# Patient Record
Sex: Female | Born: 1990 | Race: Black or African American | Hispanic: No | Smoking: Never smoker
Health system: Southern US, Community
[De-identification: ages and names within clinical notes are randomized; demographics above are authoritative.]

## PROBLEM LIST (undated history)

## (undated) DIAGNOSIS — J45909 Unspecified asthma, uncomplicated: Secondary | ICD-10-CM

## (undated) DIAGNOSIS — D649 Anemia, unspecified: Secondary | ICD-10-CM

## (undated) DIAGNOSIS — G932 Benign intracranial hypertension: Secondary | ICD-10-CM

## (undated) HISTORY — PX: EYE SURGERY: SHX253

---
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
## 2009-05-23 ENCOUNTER — Emergency Department (HOSPITAL_COMMUNITY): Admission: EM | Admit: 2009-05-23 | Discharge: 2009-05-24 | Payer: Self-pay | Admitting: Emergency Medicine

## 2010-04-08 ENCOUNTER — Emergency Department (HOSPITAL_COMMUNITY)
Admission: EM | Admit: 2010-04-08 | Discharge: 2010-04-09 | Disposition: A | Discharge status: Home / Self Care | Payer: Self-pay | Attending: Emergency Medicine | Admitting: Emergency Medicine

## 2010-04-08 DIAGNOSIS — R42 Dizziness and giddiness: Secondary | ICD-10-CM | POA: Insufficient documentation

## 2010-04-08 DIAGNOSIS — N938 Other specified abnormal uterine and vaginal bleeding: Secondary | ICD-10-CM | POA: Insufficient documentation

## 2010-04-08 DIAGNOSIS — R110 Nausea: Secondary | ICD-10-CM | POA: Insufficient documentation

## 2010-04-08 DIAGNOSIS — N949 Unspecified condition associated with female genital organs and menstrual cycle: Secondary | ICD-10-CM | POA: Insufficient documentation

## 2010-04-08 LAB — URINE MICROSCOPIC-ADD ON

## 2010-04-08 LAB — URINALYSIS, ROUTINE W REFLEX MICROSCOPIC
Bilirubin Urine: NEGATIVE
Specific Gravity, Urine: 1.025 (ref 1.005–1.030)
pH: 7 (ref 5.0–8.0)

## 2010-04-08 LAB — POCT PREGNANCY, URINE: Preg Test, Ur: NEGATIVE

## 2010-12-01 ENCOUNTER — Inpatient Hospital Stay (HOSPITAL_COMMUNITY)
Admission: EM | Admit: 2010-12-01 | Discharge: 2010-12-05 | DRG: 195 | Disposition: A | Discharge status: Home / Self Care | Payer: Self-pay | Attending: Family Medicine | Admitting: Family Medicine

## 2010-12-01 ENCOUNTER — Encounter: Payer: Self-pay | Admitting: *Deleted

## 2010-12-01 ENCOUNTER — Emergency Department (HOSPITAL_COMMUNITY): Payer: Self-pay

## 2010-12-01 DIAGNOSIS — R000 Tachycardia, unspecified: Secondary | ICD-10-CM | POA: Diagnosis present

## 2010-12-01 DIAGNOSIS — J180 Bronchopneumonia, unspecified organism: Secondary | ICD-10-CM

## 2010-12-01 DIAGNOSIS — J189 Pneumonia, unspecified organism: Principal | ICD-10-CM | POA: Diagnosis present

## 2010-12-01 DIAGNOSIS — D509 Iron deficiency anemia, unspecified: Secondary | ICD-10-CM | POA: Diagnosis present

## 2010-12-01 DIAGNOSIS — R0902 Hypoxemia: Secondary | ICD-10-CM | POA: Diagnosis present

## 2010-12-01 LAB — BASIC METABOLIC PANEL
CO2: 23 mEq/L (ref 19–32)
Calcium: 9.2 mg/dL (ref 8.4–10.5)
Creatinine, Ser: 0.88 mg/dL (ref 0.50–1.10)
GFR calc Af Amer: >90 mL/min (ref >90)
GFR calc non Af Amer: >90 mL/min (ref >90)
Sodium: 136 mEq/L (ref 135–145)

## 2010-12-01 LAB — DIFFERENTIAL
Basophils Absolute: 0.1 10*3/uL (ref 0.0–0.1)
Basophils Relative: 1 % (ref 0–1)
Eosinophils Relative: 8 % — ABNORMAL HIGH (ref 0–5)
Lymphocytes Relative: 21 % (ref 12–46)

## 2010-12-01 LAB — URINALYSIS, ROUTINE W REFLEX MICROSCOPIC
Glucose, UA: NEGATIVE mg/dL
Ketones, ur: NEGATIVE mg/dL
Leukocytes, UA: NEGATIVE
Nitrite: NEGATIVE
Protein, ur: 100 mg/dL — AB
Urobilinogen, UA: 0.2 mg/dL (ref 0.0–1.0)

## 2010-12-01 LAB — CBC
MCHC: 30.5 g/dL (ref 30.0–36.0)
MCV: 60.6 fL — ABNORMAL LOW (ref 78.0–100.0)
Platelets: 284 10*3/uL (ref 150–400)
RDW: 20.4 % — ABNORMAL HIGH (ref 11.5–15.5)
WBC: 11.8 10*3/uL — ABNORMAL HIGH (ref 4.0–10.5)

## 2010-12-01 LAB — INFLUENZA PANEL BY PCR (TYPE A & B)
Influenza A By PCR: NEGATIVE
Influenza B By PCR: NEGATIVE

## 2010-12-01 LAB — D-DIMER, QUANTITATIVE: D-Dimer, Quant: 1.11 ug/mL-FEU — ABNORMAL HIGH (ref 0.00–0.48)

## 2010-12-01 MED ORDER — PNEUMOCOCCAL VAC POLYVALENT 25 MCG/0.5ML IJ INJ
0.5000 mL | INJECTION | INTRAMUSCULAR | Status: AC
  Administered 2010-12-02: 0.5 mL via INTRAMUSCULAR
  Filled 2010-12-01: qty 0.5

## 2010-12-01 MED ORDER — SODIUM CHLORIDE 0.9 % IV SOLN
INTRAVENOUS | Status: DC
  Administered 2010-12-01: 950 mL via INTRAVENOUS

## 2010-12-01 MED ORDER — DEXTROSE 5 % IV SOLN
1.0000 g | Freq: Once | INTRAVENOUS | Status: AC
  Administered 2010-12-01: 13:00:00 via INTRAVENOUS
  Filled 2010-12-01: qty 10

## 2010-12-01 MED ORDER — ACETAMINOPHEN 500 MG PO TABS: 500.0000 mg | ORAL_TABLET | ORAL | Status: DC | PRN

## 2010-12-01 MED ORDER — ENOXAPARIN SODIUM 40 MG/0.4ML ~~LOC~~ SOLN
40.0000 mg | SUBCUTANEOUS | Status: DC
  Administered 2010-12-01: 40 mg via SUBCUTANEOUS
  Filled 2010-12-01: qty 0.4

## 2010-12-01 MED ORDER — SODIUM CHLORIDE 0.9 % IN NEBU
INHALATION_SOLUTION | RESPIRATORY_TRACT | Status: AC
  Administered 2010-12-01: 12:00:00
  Filled 2010-12-01: qty 3

## 2010-12-01 MED ORDER — DEXTROSE 5 % IV SOLN
1.0000 g | INTRAVENOUS | Status: DC
  Administered 2010-12-02 – 2010-12-04 (×3): 1 g via INTRAVENOUS
  Filled 2010-12-01 (×5): qty 10

## 2010-12-01 MED ORDER — GUAIFENESIN ER 600 MG PO TB12
1200.0000 mg | ORAL_TABLET | Freq: Two times a day (BID) | ORAL | Status: DC
  Administered 2010-12-01 – 2010-12-05 (×7): 1200 mg via ORAL
  Filled 2010-12-01 (×8): qty 2

## 2010-12-01 MED ORDER — ALBUTEROL SULFATE HFA 108 (90 BASE) MCG/ACT IN AERS
2.0000 | INHALATION_SPRAY | Freq: Once | RESPIRATORY_TRACT | Status: AC
  Administered 2010-12-01: 2 via RESPIRATORY_TRACT
  Filled 2010-12-01: qty 6.7

## 2010-12-01 MED ORDER — ALBUTEROL SULFATE (5 MG/ML) 0.5% IN NEBU
5.0000 mg | INHALATION_SOLUTION | Freq: Once | RESPIRATORY_TRACT | Status: AC
  Administered 2010-12-01: 5 mg via RESPIRATORY_TRACT
  Filled 2010-12-01: qty 1

## 2010-12-01 MED ORDER — SODIUM CHLORIDE 0.9 % IV BOLUS (SEPSIS)
1000.0000 mL | Freq: Once | INTRAVENOUS | Status: AC
  Administered 2010-12-01: 1000 mL via INTRAVENOUS

## 2010-12-01 MED ORDER — DEXTROSE 5 % IV SOLN
500.0000 mg | INTRAVENOUS | Status: DC
  Administered 2010-12-02 – 2010-12-04 (×3): 500 mg via INTRAVENOUS
  Filled 2010-12-01 (×5): qty 500

## 2010-12-01 MED ORDER — DEXTROSE 5 % IV SOLN
500.0000 mg | Freq: Once | INTRAVENOUS | Status: AC
  Administered 2010-12-01: 500 mg via INTRAVENOUS
  Filled 2010-12-01: qty 500

## 2010-12-01 MED ORDER — KETOROLAC TROMETHAMINE 30 MG/ML IJ SOLN
30.0000 mg | Freq: Once | INTRAMUSCULAR | Status: AC
  Administered 2010-12-01: 30 mg via INTRAVENOUS
  Filled 2010-12-01: qty 1

## 2010-12-01 MED ORDER — IOHEXOL 350 MG/ML SOLN
100.0000 mL | Freq: Once | INTRAVENOUS | Status: AC | PRN
  Administered 2010-12-01: 100 mL via INTRAVENOUS

## 2010-12-01 MED ORDER — METHYLPREDNISOLONE SODIUM SUCC 125 MG IJ SOLR
125.0000 mg | Freq: Once | INTRAMUSCULAR | Status: AC
  Administered 2010-12-01: 125 mg via INTRAVENOUS
  Filled 2010-12-01: qty 2

## 2010-12-01 MED ORDER — SODIUM CHLORIDE 0.9 % IV BOLUS (SEPSIS)
1000.0000 mL | Freq: Once | INTRAVENOUS | Status: AC
  Administered 2010-12-03: 1000 mL via INTRAVENOUS

## 2010-12-01 MED ORDER — HYDROCODONE-HOMATROPINE 5-1.5 MG/5ML PO SYRP
5.0000 mL | ORAL_SOLUTION | ORAL | Status: DC | PRN
  Administered 2010-12-03 – 2010-12-04 (×4): 5 mL via ORAL
  Filled 2010-12-01 (×4): qty 5

## 2010-12-01 NOTE — ED Provider Notes (Signed)
Scribed for Dayton Bailiff, MD, the patient was seen in room APA12/APA12 . This chart was scribed by Ellie Lunch.    CSN: [id number] Arrival date & time: 12/01/2010  8:49 AM   First MD Initiated Contact with Patient 12/01/10 [phone]      Chief Complaint  Patient presents with  . Cough    (Consider location/radiation/quality/duration/timing/severity/associated sxs/prior treatment) HPI Kathryn Harris is a 20 y.o. female who presents to the Emergency Department complaining of constant productive cough with yellow phlegm for two weeks.  Associated symptoms include sore throat, myalgias, subjective fever, intermittent ear pain and congestion. Pt also c/o of a chest wall tenderness and back pain when she coughs. Pt. Treats symptoms with mucinex and alka seltzer plus with no improvement. The pt. denies nausea, vomiting, and diarrhea. Pt has been eating well, but not drinking well.  In addition, the pt states that she has been drinking sodas.  Pt denies having flu shot.  No abdominal pain, urinary sx.   History reviewed. No pertinent past medical history.  History reviewed. No pertinent past surgical history.  History reviewed. No pertinent family history.  History  Substance Use Topics  . Smoking status: Former Games developer  . Smokeless tobacco: Not on file  . Alcohol Use: Yes     occasionally    Review of Systems  10 Systems reviewed and are negative for acute change except as noted in the HPI.   Allergies  Review of patient's allergies indicates no known allergies.  Home Medications   Current Outpatient Rx  Name Route Sig Dispense Refill  . GUAIFENESIN 600 MG PO TB12 Oral Take 1,200 mg by mouth 2 (two) times daily.        BP 116/93  Pulse 124  Temp(Src) 97.7 F (36.5 C) (Oral)  Resp 20  Ht 5\' 5"  (1.651 m)  Wt 260 lb (117.935 kg)  BMI 43.27 kg/m2  SpO2 93%  LMP 11/28/2010  Physical Exam  Nursing note and vitals reviewed. Constitutional: She is oriented to person,  place, and time. She appears well-developed and well-nourished.  HENT:  Head: Normocephalic and atraumatic.  Mouth/Throat: Oropharynx is clear and moist. Mucous membranes are dry. No oropharyngeal exudate.  Eyes: Conjunctivae and EOM are normal. Pupils are equal, round, and reactive to light.  Neck: Normal range of motion. Neck supple.  Cardiovascular: Regular rhythm.        Tachycardic  Pulmonary/Chest: Effort normal. She has wheezes (corse).       rhonchi  Abdominal: Soft. Bowel sounds are normal.  Musculoskeletal: Normal range of motion. She exhibits no edema.  Neurological: She is alert and oriented to person, place, and time.  Skin: Skin is warm and dry.  Psychiatric: She has a normal mood and affect.    ED Course  Procedures (including critical care time)  CRITICAL CARE Performed by: Dayton Bailiff   Total critical care time: 30 minutes  Critical care time was exclusive of separately billable procedures and treating other patients.  Critical care was necessary to treat or prevent imminent or life-threatening deterioration.  Critical care was time spent personally by me on the following activities: development of treatment plan with patient and/or surrogate as well as nursing, discussions with consultants, evaluation of patient's response to treatment, examination of patient, obtaining history from patient or surrogate, ordering and performing treatments and interventions, ordering and review of laboratory studies, ordering and review of radiographic studies, pulse oximetry and re-evaluation of patient's condition.  Labs Reviewed  CBC - Abnormal;  Notable for the following:    WBC 11.8 (*)    RBC 5.73 (*)    Hemoglobin 10.6 (*)    HCT 34.7 (*)    MCV 60.6 (*)    MCH 18.5 (*)    RDW 20.4 (*)    All other components within normal limits  DIFFERENTIAL - Abnormal; Notable for the following:    Eosinophils Relative 8 (*)    Eosinophils Absolute 1.0 (*)    All other  components within normal limits  BASIC METABOLIC PANEL - Abnormal; Notable for the following:    Glucose, Bld 100 (*)    All other components within normal limits  URINALYSIS, ROUTINE W REFLEX MICROSCOPIC - Abnormal; Notable for the following:    Specific Gravity, Urine <1.005 (*)    Hgb urine dipstick MODERATE (*)    Protein, ur 100 (*)    All other components within normal limits  D-DIMER, QUANTITATIVE - Abnormal; Notable for the following:    D-Dimer, Quant 1.11 (*)    All other components within normal limits  URINE MICROSCOPIC-ADD ON - Abnormal; Notable for the following:    Casts GRANULAR CAST (*) HYALINE CASTS   All other components within normal limits   Dg Chest 2 View  12/01/2010  *RADIOLOGY REPORT*  Clinical Data: Cough, asthma  CHEST - 2 VIEW  Comparison: None.  Findings: Normal cardiac silhouette and mediastinal contours. There is mild diffuse increased conspicuity of the pulmonary interstitium without focal airspace opacity.  No pleural effusion or pneumothorax.  No acute osseous abnormalities.  IMPRESSION: Findings suggestive of airways disease/bronchitis.  No focal airspace opacity to suggest pneumonia.  Original Report Authenticated By: Waynard Reeds, M.D.   Ct Angio Chest W/cm &/or Wo Cm  12/01/2010  *RADIOLOGY REPORT*  Clinical Data:  Chest pain, shortness of breath and elevated D- dimer.  CT ANGIOGRAPHY CHEST WITH CONTRAST  Technique:  Multidetector CT imaging of the chest was performed using the standard protocol during bolus administration of intravenous contrast.  Multiplanar CT image reconstructions including MIPs were obtained to evaluate the vascular anatomy.  Contrast: OMNIPAQUE IOHEXOL 350 MG/ML IV SOLN  Comparison:  None  Findings:  The chest wall is unremarkable.  No definite breast masses, supraclavicular or axillary lymphadenopathy.  Small scattered lymph nodes are noted.  The bony thorax is intact.  The heart is normal in size.  No pericardial effusion.   The aorta is normal in caliber.  No dissection.  Residual thymic tissue noted in the anterior mediastinum.  There are borderline enlarged mediastinal and hilar lymph nodes.  The pulmonary arterial tree is suboptimally opacified.  No obvious filling defects to suggest pulmonary emboli.  Examination of the lung parenchyma demonstrates diffuse perihilar peribronchial thickening, patchy nodular airspace opacities and streaky areas of atelectasis.  Findings could be secondary to severe bronchitis or atypical bronchopneumonia.  No focal airspace consolidation / lobar pneumonia.  No pleural effusions or pulmonary edema.  The upper abdomen is unremarkable except for splenomegaly.  Review of the MIP images confirms the above findings.  IMPRESSION:  1.  No CT findings for pulmonary embolism.  Suboptimal examination. 2.  Normal thoracic aorta. 3.  Severe bronchitis or atypical diffuse bronchopneumonia.  No focal airspace consolidation / lobar pneumonia. 4.  Borderline enlarged mediastinal and hilar lymph nodes. 5.  Splenomegaly.  Original Report Authenticated By: P. Loralie Champagne, M.D.     DIAGNOSTIC STUDIES: Oxygen Saturation is 93% on room air, adequate by my interpretation.  COORDINATION OF CARE:  9:07 a.m. EDP at bedside discusses plan to hydrate with IV fluids and give breathing treatment. Plan to run blood work and xray chest.   ED MEDICATIONS Medications  sodium chloride 0.9 % nebulizer solution   sodium chloride 0.9 % bolus 1,000 mL (1000 mL Intravenous Given 12/01/10 1130)  cefTRIAXone (ROCEPHIN) 1 g in dextrose 5 % 50 mL IVPB   azithromycin (ZITHROMAX) 500 mg in dextrose 5 % 250 mL IVPB (500 mg Intravenous New Bag 12/01/10 1133)  albuterol (PROVENTIL) (5 MG/ML) 0.5% nebulizer solution 5 mg   sodium chloride 0.9 % bolus 1,000 mL (1000 mL Intravenous Given 12/01/10 0941)  methylPREDNISolone sodium succinate (SOLU-MEDROL) 125 MG injection 125 mg (125 mg Intravenous Given 12/01/10 0942)  albuterol  (PROVENTIL) (5 MG/ML) 0.5% nebulizer solution 5 mg (5 mg Nebulization Given 12/01/10 0931)  ketorolac (TORADOL) 30 MG/ML injection 30 mg (30 mg Intravenous Given 12/01/10 0942)  iohexol (OMNIPAQUE) 350 MG/ML injection 100 mL (100 mL Intravenous Contrast Given 12/01/10 1056)     1. Bronchopneumonia   2. Tachycardia      MDM  The patient's tachycardia and hypoxia arrival a d-dimer was performed and was elevated. CT was negative for PE however showed atypical bronchopneumonia. She is placed on Rocephin Zithromax. She received 3 albuterol treatments and steroids with improvement of her oxygen saturation however a walk of life her heart rate increased to 140. She received 2 half liters of fluid remains persistently tachycardic. I feel she warrants admission for further evaluation and treatment. I discussed the case with her primary care physician who accepted the patient for admission.  Influenza was considered however patient out of treatment window  I personally performed the services described in this documentation, which was scribed in my presence. The recorded information has been reviewed and considered.        Dayton Bailiff, MD 12/01/10 [phone]

## 2010-12-01 NOTE — ED Notes (Signed)
Pt waiting to be reeval and disposition 

## 2010-12-01 NOTE — ED Notes (Signed)
Ambulated pt, 02 sats stayed between 97-100%.  Pt denied any sob.

## 2010-12-01 NOTE — ED Notes (Signed)
In to recheck admit vitals. Pt states she is not staying in the hospital. Has taken her gown off and put her clothes back on. edp aware

## 2010-12-01 NOTE — ED Notes (Signed)
Did in and out cath but only got one drop of urine, will try a little later

## 2010-12-01 NOTE — H&P (Signed)
Kathryn Harris, Kathryn Harris           ACCOUNT NO.:  [id number]  MEDICAL RECORD NO.:  [id number]  LOCATION:  A308                          FACILITY:  [facility]  PHYSICIAN:  Indi Willhite G. Renard Matter, MD   DATE OF BIRTH:  1990/08/22  DATE OF ADMISSION:  12/01/2010 DATE OF DISCHARGE:  LH                             HISTORY & PHYSICAL   This 20 year old African American female presented to the emergency department where she was seen by ED physician.  She apparently has symptoms of cough for approximately 2 weeks.  She did have low-grade fever, sore throat, and productive cough.  She had no gastrointestinal symptoms.  A chest x-ray showed findings suggestive of airway disease, bronchitis.  No focal airspace disease to suggest pneumonia.  CT of the chest, no CT findings of pulmonary embolism, borderline enlarged mediastinal hilar nodes present and splenomegaly.  No focal airspace disease, consolidation, or pneumonia noted.  CBC:  WBC 11,800 with hemoglobin 10.6, hematocrit 34.7.  Urinalysis essentially negative.  The patient was started on IV Rocephin 1 g and Zithromax 500 mg.  It was felt she should be admitted for further evaluation.  SOCIAL HISTORY:  The patient did smoke formerly.  She does use alcohol on occasions.  PAST MEDICAL AND SURGICAL HISTORY:  The patient had no prior surgery or medical illnesses.  REVIEW OF SYSTEMS:  HEENT:  Negative.  CARDIOPULMONARY:  The patient has had cough and shortness of breath over the past few days.  GI:  No bowel irregularity or bleeding.  GU:  No dysuria or hematuria.  ALLERGIES:  No known allergies present.  HOME MEDICATIONS:  Guaifenesin 1200 mg twice a day.  PHYSICAL EXAMINATION:  GENERAL:  Alert, healthy-appearing female. VITAL SIGNS:  Blood pressure 116/93, pulse 124, temp 97.7. HEENT:  Eyes, PERRLA.  TMs negative.  Oropharynx benign. NECK:  Supple.  No JVD or thyroid abnormalities. LUNGS:  Occasional rhonchus heard over lower lung field. HEART:   Sinus tachycardia.  No murmurs. ABDOMEN:  No palpable organs or masses. EXTREMITIES:  Free of edema. NEUROLOGIC:  No focal deficit.  ASSESSMENT:  The patient was admitted with what was felt to be atypical pneumonia.  She had tachycardia on admission and was given fluid bolus.  PLAN:  To continue current regimen.     Oluwatimileyin Vivier G. Renard Matter, MD     AGM/MEDQ  D:  12/01/2010  T:  12/01/2010  Job:  [id number]

## 2010-12-01 NOTE — ED Notes (Signed)
Pt c/o cold, cough, congestion, wheezing, sore throat and aching all over for 2 weeks. Pt states that she is coughing up yellow phlegm. Denies fever, nausea, vomiting or diarrhea.

## 2010-12-02 MED ORDER — ENOXAPARIN SODIUM 60 MG/0.6ML ~~LOC~~ SOLN
60.0000 mg | SUBCUTANEOUS | Status: DC
  Administered 2010-12-02 – 2010-12-04 (×4): 60 mg via SUBCUTANEOUS
  Filled 2010-12-02 (×3): qty 0.6

## 2010-12-02 MED ORDER — SODIUM CHLORIDE 0.9 % IJ SOLN
INTRAMUSCULAR | Status: AC
  Administered 2010-12-02: 17:00:00
  Filled 2010-12-02: qty 3

## 2010-12-02 MED ORDER — SODIUM CHLORIDE 0.9 % IJ SOLN
INTRAMUSCULAR | Status: AC
  Administered 2010-12-02: 15:00:00
  Filled 2010-12-02: qty 3

## 2010-12-02 NOTE — Progress Notes (Signed)
NAMEJANIECE, SCOVILL           ACCOUNT NO.:  [id number]  MEDICAL RECORD NO.:  [id number]  LOCATION:  A308                          FACILITY:  [facility]  PHYSICIAN:  Mila Homer. Sudie Bailey, M.D.DATE OF BIRTH:  [birth year]  DATE OF PROCEDURE: DATE OF DISCHARGE:                                PROGRESS NOTE   SUBJECTIVE:  She feels about the same as last night.  She was admitted with diffuse bronchopneumonia.  OBJECTIVE:  VITAL SIGNS:  The temp is 98.1, pulse 79, respiratory rate 20, blood pressure 104/65. GENERAL:  She is sitting up in bed.  She is in no acute distress.  She is well-developed and obese. LUNGS:  Appear clear throughout today.  She has no intercostal retractions or use of accessory muscles of respiration. HEART:  Regular rhythm, rate of about 80. HEENT:  Mucous membranes are moist. SKIN:  Turgor is normal.  LABORATORY DATA:  White cell count was 11,800, hemoglobin is 10.6.  The CT angio of the chest showed severe bronchitis or atypical diffuse bronchopneumonia.  ASSESSMENT:  Pneumonia.  PLAN:  Continue with Zithromax and Rocephin.  Repeat CBC tomorrow. Discussed with her.     Mila Homer. Sudie Bailey, M.D.     SDK/MEDQ  D:  12/02/2010  T:  12/02/2010  Job:  [id number]

## 2010-12-03 LAB — CBC
MCH: 18.8 pg — ABNORMAL LOW (ref 26.0–34.0)
MCHC: 30.7 g/dL (ref 30.0–36.0)
Platelets: 281 10*3/uL (ref 150–400)

## 2010-12-04 MED ORDER — SODIUM CHLORIDE 0.9 % IJ SOLN
INTRAMUSCULAR | Status: AC
  Filled 2010-12-04: qty 3

## 2010-12-04 MED ORDER — ONDANSETRON 4 MG PO TBDP
4.0000 mg | ORAL_TABLET | Freq: Four times a day (QID) | ORAL | Status: DC | PRN
  Administered 2010-12-04: 4 mg via ORAL
  Filled 2010-12-04: qty 1

## 2010-12-04 MED ORDER — ONDANSETRON HCL 4 MG/2ML IJ SOLN
4.0000 mg | Freq: Four times a day (QID) | INTRAMUSCULAR | Status: DC | PRN
  Filled 2010-12-04: qty 2

## 2010-12-04 NOTE — Progress Notes (Signed)
Kathryn Harris, Kathryn Harris           ACCOUNT NO.:  [id number]  MEDICAL RECORD NO.:  [id number]  LOCATION:  A308                          FACILITY:  [facility]  PHYSICIAN:  Jamise Pentland D. Felecia Shelling, MD   DATE OF BIRTH:  April 26, 1990  DATE OF PROCEDURE:  12/04/2010 DATE OF DISCHARGE:                                PROGRESS NOTE   SUBJECTIVE:  The patient feels better.  She is receiving IV antibiotics. The patient is ambulating.  She had some nausea and abdominal discomfort during the night.  OBJECTIVE:  GENERAL:  The patient is alert, awake, and sick looking. VITALS:  Blood pressure 92/60, pulse 75, respiratory rate 16, temperature 97 degrees Fahrenheit. CHEST:  Decreased air entry, few rhonchi. CARDIOVASCULAR SYSTEM:  First and second heart sounds heard.  No murmur. No gallop. ABDOMEN:  Soft and lax.  Bowel sound is positive.  No mass or organomegaly. EXTREMITIES:  No leg edema.  ASSESSMENT: 1. Community-acquired pneumonia. 2. Anemia.  PLAN:  Continue the patient on IV antibiotics.  We will repeat CBC and BMP.  Continue her regular treatment.     Athleen Feltner D. Felecia Shelling, MD     TDF/MEDQ  D:  12/04/2010  T:  12/04/2010  Job:  [id number]

## 2010-12-05 LAB — BASIC METABOLIC PANEL
Chloride: 103 mEq/L (ref 96–112)
Creatinine, Ser: 0.68 mg/dL (ref 0.50–1.10)
GFR calc Af Amer: >90 mL/min (ref >90)
GFR calc non Af Amer: >90 mL/min (ref >90)
Potassium: 3.6 mEq/L (ref 3.5–5.1)

## 2010-12-05 LAB — CBC
MCHC: 30.1 g/dL (ref 30.0–36.0)
Platelets: 322 10*3/uL (ref 150–400)
RDW: 19.8 % — ABNORMAL HIGH (ref 11.5–15.5)
WBC: 10.1 10*3/uL (ref 4.0–10.5)

## 2010-12-05 MED ORDER — CEFUROXIME AXETIL 250 MG PO TABS: 500.0000 mg | ORAL_TABLET | Freq: Two times a day (BID) | ORAL | Status: AC

## 2010-12-05 MED ORDER — AZITHROMYCIN 250 MG PO TABS: 250.0000 mg | ORAL_TABLET | Freq: Every day | ORAL | Status: DC

## 2010-12-05 NOTE — Progress Notes (Signed)
Kathryn Harris, Kathryn Harris           ACCOUNT NO.:  [id number]  MEDICAL RECORD NO.:  [id number]  LOCATION:                                 FACILITY:  PHYSICIAN:  Nyeli Holtmeyer D. Felecia Shelling, MD   DATE OF BIRTH:  04-21-1990  DATE OF PROCEDURE:  12/03/2010 DATE OF DISCHARGE:                                PROGRESS NOTE   SUBJECTIVE:  The patient is feeling better.  Her breathing is improving. Her fever has subsided.  No new complaints.  OBJECTIVE:  GENERAL:  Patient is alert, awake, and resting. VITALS:  Blood pressure 104/58, pulse 82, respiratory rate 16, temperature 99 degrees Fahrenheit. CHEST:  Decreased air entry, few rhonchi. CARDIOVASCULAR SYSTEM:  First and second heart sounds heard.  No murmur. No gallop. ABDOMEN:  Soft and lax.  Bowel sound is positive.  No mass or organomegaly. EXTREMITIES:  No leg edema.  ASSESSMENT:  Community-acquired pneumonia, clinically improving.  PLAN:  Continue the patient on combination of IV Zosyn and Zithromax. Continue supportive care.     Aarsh Fristoe D. Felecia Shelling, MD     TDF/MEDQ  D:  12/03/2010  T:  12/03/2010  Job:  [id number]

## 2010-12-05 NOTE — Discharge Summary (Signed)
[name], Kathryn Harris           ACCOUNT NO.:  [id number]  MEDICAL RECORD NO.:  [id number]  LOCATION:  A308                          FACILITY:  [facility]  PHYSICIAN:  Mila Homer. Sudie Bailey, M.D.DATE OF BIRTH:  11-01-1990  DATE OF ADMISSION:  12/01/2010 DATE OF DISCHARGE:  12/03/2012LH                              DISCHARGE SUMMARY   HISTORY: A 20 year old was admitted to the hospital with community-acquired pneumonia.  She had a benign 5 day hospitalization extending from December 01, 2010 to December 05, 2010.  Vital signs remained stable. Her admission white cell count was 11,800, recheck 12,110.  She has 62% neutrophils, 21 lymphs, hemoglobin was 10.6, recheck 9.5 and then 9.8.  Conversation during the hospitalization, she said she had a craving for ice, had some heavy periods, but no children.  Influenza A and B by PCR were negative.  H1N1 was negative.  UA was essentially normal.  Admission chest x-ray showed airways disease/bronchitis. She had a CT angio of the chest which showed no pulmonary embolism, but showed what appeared to be either severe bronchitis or atypical diffuse bronchopneumonia.  She also had borderline enlarged mediastinal hilar lymph nodes and splenomegaly.  Treatment was with ceftriaxone 1 gm IV q.24 h. and azithromycin 500 mg IV q.24 h.  She was also on Lovenox 60 mg daily prophylactically and p.r.n. hydrocodone/homatropine, ondansetron, acetaminophen. She did well on this regimen, gradually improving and was ready for discharge home her fifth day.  She had reached maximum hospital benefit at that time.  FINAL DIAGNOSES: 1. Community-acquired pneumonia. 2. Anemia, probably iron deficiency.  DISCHARGE INSTRUCTIONS: 1. She is discharged home on Ceftin 500 mg b.i.d. for 5 days. 2. Zithromax 250 mg for 5 days. 3. She will follow up in the office in 1 week.  We will get further     workup on her anemia at that time.     Mila Homer. Sudie Bailey,  M.D.     SDK/MEDQ  D:  12/05/2010  T:  12/05/2010  Job:  [id number]

## 2010-12-08 NOTE — Progress Notes (Signed)
Patient discharged home with instructions given on medications,and follow up visits with PCP.No C/O pain or discomfort noted .Accompanied by staff to an awaiting vehicle.

## 2011-07-04 ENCOUNTER — Other Ambulatory Visit (HOSPITAL_COMMUNITY)
Admission: RE | Admit: 2011-07-04 | Discharge: 2011-07-04 | Disposition: A | Discharge status: Home / Self Care | Payer: Self-pay | Source: Ambulatory Visit | Attending: Unknown Physician Specialty | Admitting: Unknown Physician Specialty

## 2011-07-04 DIAGNOSIS — R87612 Low grade squamous intraepithelial lesion on cytologic smear of cervix (LGSIL): Secondary | ICD-10-CM | POA: Insufficient documentation

## 2011-07-04 DIAGNOSIS — N870 Mild cervical dysplasia: Secondary | ICD-10-CM | POA: Insufficient documentation

## 2011-10-24 ENCOUNTER — Encounter (HOSPITAL_COMMUNITY): Payer: Self-pay

## 2011-10-24 ENCOUNTER — Emergency Department (HOSPITAL_COMMUNITY)
Admission: EM | Admit: 2011-10-24 | Discharge: 2011-10-24 | Disposition: A | Discharge status: Home / Self Care | Payer: Self-pay | Attending: Emergency Medicine | Admitting: Emergency Medicine

## 2011-10-24 DIAGNOSIS — Z87891 Personal history of nicotine dependence: Secondary | ICD-10-CM | POA: Insufficient documentation

## 2011-10-24 DIAGNOSIS — N760 Acute vaginitis: Secondary | ICD-10-CM | POA: Insufficient documentation

## 2011-10-24 LAB — WET PREP, GENITAL: Yeast Wet Prep HPF POC: NONE SEEN

## 2011-10-24 MED ORDER — METRONIDAZOLE 500 MG PO TABS: 500.0000 mg | ORAL_TABLET | Freq: Two times a day (BID) | ORAL | Status: DC

## 2011-10-24 NOTE — ED Notes (Signed)
Vaginal discharge, possible yeast infection. Started on Saturday per pt. Denies abdominal pain.

## 2011-10-24 NOTE — ED Provider Notes (Signed)
History  This chart was scribed for Kathryn Lennert, MD by Bennett Scrape. This patient was seen in room APA17/APA17 and the patient's care was started at 10:12PM.  CSN: [id number]  Arrival date & time 10/24/11  2100   First MD Initiated Contact with Patient 10/24/11 2212      Chief Complaint  Patient presents with  . Vaginitis     Patient is a 21 y.o. female presenting with vaginal discharge. The history is provided by the patient. No language interpreter was used.  Vaginal Discharge This is a recurrent problem. The current episode started more than 2 days ago. The problem occurs daily. The problem has not changed since onset.Pertinent negatives include no chest pain, no abdominal pain, no headaches and no shortness of breath. Nothing aggravates the symptoms. Nothing relieves the symptoms.    Kathryn Harris is a 21 y.o. female who presents to the Emergency Department complaining of 3 days of gradual onset, gradually worsening, constant vaginal discharge described as white. She denies having any modifying factors and denies taking OTC medications at home to improve symptoms. She reports prior episodes of symptoms diagnosed as yeast infections. She denies vaginal itching, vaginal pain, vaginal bleeding, fevers, abdominal pain and urinary symptoms as associated symptoms. She reports that her LNMP started today. She does not have a h/o chronic medical conditions and is an occasional alcohol user and is a former smoker.   History reviewed. No pertinent past medical history.  History reviewed. No pertinent past surgical history.  No family history on file.  History  Substance Use Topics  . Smoking status: Former Smoker    Quit date: 12/01/2007  . Smokeless tobacco: Former Neurosurgeon    Quit date: 07/01/2007  . Alcohol Use: 1.8 oz/week    3 Shots of liquor per week     occasionally    No OB history provided.  Review of Systems  Constitutional: Negative for fatigue.  HENT:  Negative for congestion, sinus pressure and ear discharge.   Eyes: Negative for discharge.  Respiratory: Negative for cough and shortness of breath.   Cardiovascular: Negative for chest pain.  Gastrointestinal: Negative for abdominal pain and diarrhea.  Genitourinary: Positive for vaginal discharge. Negative for frequency and hematuria.  Musculoskeletal: Negative for back pain.  Skin: Negative for rash.  Neurological: Negative for seizures and headaches.  Hematological: Negative.   Psychiatric/Behavioral: Negative for hallucinations.    Allergies  Review of patient's allergies indicates no known allergies.  Home Medications   Current Outpatient Rx  Name Route Sig Dispense Refill  . AZITHROMYCIN 250 MG PO TABS Oral Take 1 tablet (250 mg total) by mouth daily. 5 each 0  . GUAIFENESIN ER 600 MG PO TB12 Oral Take 1,200 mg by mouth 2 (two) times daily.        Triage Vitals: BP 125/68  Pulse 66  Temp 98.6 F (37 C) (Oral)  Resp 18  Ht 5\' 5"  (1.651 m)  Wt 265 lb (120.203 kg)  BMI 44.10 kg/m2  SpO2 100%  LMP 10/24/2011  Physical Exam  Nursing note and vitals reviewed. Constitutional: She is oriented to person, place, and time. She appears well-developed.  HENT:  Head: Normocephalic and atraumatic.  Eyes: Conjunctivae normal and EOM are normal. No scleral icterus.  Neck: Neck supple. No thyromegaly present.  Cardiovascular: Normal rate and regular rhythm.  Exam reveals no gallop and no friction rub.   No murmur heard. Pulmonary/Chest: No stridor. She has no wheezes. She has no rales.  She exhibits no tenderness.  Abdominal: She exhibits no distension. There is no tenderness. There is no rebound.  Genitourinary:       No vaginal discharge noted, no tenderness, small amount of blood in the cervical os  Musculoskeletal: Normal range of motion. She exhibits no edema.  Lymphadenopathy:    She has no cervical adenopathy.  Neurological: She is oriented to person, place, and time.  Coordination normal.  Skin: No rash noted. No erythema.  Psychiatric: She has a normal mood and affect. Her behavior is normal.    ED Course  Procedures (including critical care time)  DIAGNOSTIC STUDIES: Oxygen Saturation is 100% on room air.  normal by my interpretation.    COORDINATION OF CARE: 10:20PM-Patient informed of current plan for treatment which includes a pelvic exam and evaluation and agrees with plan at this time.   Labs Reviewed  WET PREP, GENITAL - Abnormal; Notable for the following:    Clue Cells Wet Prep HPF POC MANY (*)     WBC, Wet Prep HPF POC MODERATE (*)     All other components within normal limits   No results found.   No diagnosis found.    MDM     The chart was scribed for me under my direct supervision.  I personally performed the history, physical, and medical decision making and all procedures in the evaluation of this patient.Kathryn Lennert, MD 10/27/11 1455

## 2011-11-09 ENCOUNTER — Encounter (HOSPITAL_COMMUNITY): Payer: Self-pay | Admitting: Emergency Medicine

## 2011-11-09 ENCOUNTER — Emergency Department (HOSPITAL_COMMUNITY)
Admission: EM | Admit: 2011-11-09 | Discharge: 2011-11-09 | Disposition: A | Discharge status: Home / Self Care | Payer: Self-pay | Attending: Emergency Medicine | Admitting: Emergency Medicine

## 2011-11-09 DIAGNOSIS — Z87891 Personal history of nicotine dependence: Secondary | ICD-10-CM | POA: Insufficient documentation

## 2011-11-09 DIAGNOSIS — B3731 Acute candidiasis of vulva and vagina: Secondary | ICD-10-CM | POA: Insufficient documentation

## 2011-11-09 DIAGNOSIS — B373 Candidiasis of vulva and vagina: Secondary | ICD-10-CM

## 2011-11-09 DIAGNOSIS — N760 Acute vaginitis: Secondary | ICD-10-CM | POA: Insufficient documentation

## 2011-11-09 DIAGNOSIS — B9689 Other specified bacterial agents as the cause of diseases classified elsewhere: Secondary | ICD-10-CM | POA: Insufficient documentation

## 2011-11-09 LAB — WET PREP, GENITAL: Trich, Wet Prep: NONE SEEN

## 2011-11-09 LAB — URINALYSIS, ROUTINE W REFLEX MICROSCOPIC
Nitrite: NEGATIVE
Specific Gravity, Urine: >1.03 — ABNORMAL HIGH (ref 1.005–1.030)
pH: 5.5 (ref 5.0–8.0)

## 2011-11-09 LAB — URINE MICROSCOPIC-ADD ON

## 2011-11-09 LAB — PREGNANCY, URINE: Preg Test, Ur: NEGATIVE

## 2011-11-09 MED ORDER — FLUCONAZOLE 150 MG PO TABS: 150.0000 mg | ORAL_TABLET | Freq: Every day | ORAL | Status: AC

## 2011-11-09 MED ORDER — METRONIDAZOLE 500 MG PO TABS: 500.0000 mg | ORAL_TABLET | Freq: Two times a day (BID) | ORAL | Status: DC

## 2011-11-09 NOTE — ED Provider Notes (Signed)
History     CSN: [id number]  Arrival date & time 11/09/11  1018   First MD Initiated Contact with Patient 11/09/11 1059      Chief Complaint  Patient presents with  . Vaginal Discharge  . Vaginal Itching    (Consider location/radiation/quality/duration/timing/severity/associated sxs/prior treatment) HPI Comments: Mollyann R Birkey presents with a 5 days history of vaginal discharge,  Itching and slight burning sensation which worsens with urination.  She denies abdominal or pelvic pain, nausea, vomiting, fever, urinary frequency and flank pain.  She was treated for bacterial vaginosis with a 7 day course of flagyl which she completed.  Her symptoms were resolved for about 2 days,  Then returned with increased discharge.  She is sexually active with one person.  Her partner was not treated.    Patient is a 21 y.o. female presenting with vaginal itching. The history is provided by the patient.  Vaginal Itching Pertinent negatives include no abdominal pain, arthralgias, chest pain, congestion, fever, headaches, joint swelling, nausea, neck pain, numbness, rash, sore throat, vomiting or weakness.    History reviewed. No pertinent past medical history.  History reviewed. No pertinent past surgical history.  No family history on file.  History  Substance Use Topics  . Smoking status: Former Smoker    Quit date: 12/01/2007  . Smokeless tobacco: Former Neurosurgeon    Quit date: 07/01/2007  . Alcohol Use: 1.8 oz/week    3 Shots of liquor per week     Comment: occasionally    OB History    Grav Para Term Preterm Abortions TAB SAB Ect Mult Living                  Review of Systems  Constitutional: Negative for fever.  HENT: Negative for congestion, sore throat and neck pain.   Eyes: Negative.   Respiratory: Negative for chest tightness and shortness of breath.   Cardiovascular: Negative for chest pain.  Gastrointestinal: Negative for nausea, vomiting, abdominal pain and abdominal  distention.  Genitourinary: Positive for dysuria and vaginal discharge. Negative for frequency, hematuria, flank pain, difficulty urinating and menstrual problem.  Musculoskeletal: Negative for joint swelling and arthralgias.  Skin: Negative.  Negative for rash and wound.  Neurological: Negative for dizziness, weakness, light-headedness, numbness and headaches.  Hematological: Negative.   Psychiatric/Behavioral: Negative.     Allergies  Review of patient's allergies indicates no known allergies.  Home Medications   Current Outpatient Rx  Name  Route  Sig  Dispense  Refill  . FLUCONAZOLE 150 MG PO TABS   Oral   Take 1 tablet (150 mg total) by mouth daily.   1 tablet   0   . METRONIDAZOLE 500 MG PO TABS   Oral   Take 1 tablet (500 mg total) by mouth 2 (two) times daily.   14 tablet   0     BP 133/86  Pulse 95  Temp 98.4 F (36.9 C) (Oral)  Resp 18  Ht 5\' 5"  (1.651 m)  Wt 270 lb (122.471 kg)  BMI 44.93 kg/m2  SpO2 100%  LMP 10/27/2011  Physical Exam  Nursing note and vitals reviewed. Constitutional: She appears well-developed and well-nourished.  HENT:  Head: Normocephalic and atraumatic.  Eyes: Conjunctivae normal are normal.  Neck: Normal range of motion.  Cardiovascular: Normal rate, regular rhythm, normal heart sounds and intact distal pulses.   Pulmonary/Chest: Effort normal and breath sounds normal. She has no wheezes.  Abdominal: Soft. Bowel sounds are normal. There is  no tenderness.  Genitourinary: Uterus normal. Uterus is not tender. Cervix exhibits no motion tenderness and no discharge. Right adnexum displays no mass and no tenderness. Left adnexum displays no mass and no tenderness. There is erythema around the vagina. Vaginal discharge found.       Thick, white copious discharge.  Musculoskeletal: Normal range of motion.  Neurological: She is alert.  Skin: Skin is warm and dry.  Psychiatric: She has a normal mood and affect.    ED Course    Procedures (including critical care time)  Labs Reviewed  URINALYSIS, ROUTINE W REFLEX MICROSCOPIC - Abnormal; Notable for the following:    Specific Gravity, Urine >1.030 (*)     Hgb urine dipstick TRACE (*)     Bilirubin Urine SMALL (*)     Protein, ur 30 (*)     Leukocytes, UA MODERATE (*)     All other components within normal limits  WET PREP, GENITAL - Abnormal; Notable for the following:    Yeast Wet Prep HPF POC FEW (*)     Clue Cells Wet Prep HPF POC MODERATE (*)     WBC, Wet Prep HPF POC MODERATE (*)     All other components within normal limits  URINE MICROSCOPIC-ADD ON - Abnormal; Notable for the following:    Squamous Epithelial / LPF MANY (*)     Bacteria, UA MANY (*)     All other components within normal limits  PREGNANCY, URINE  GC/CHLAMYDIA PROBE AMP  URINE CULTURE   No results found.   1. Bacterial vaginosis   2. Yeast vaginitis       MDM  Bacterial vaginosis with yeast vaginitis.  Gc/chlamydia cx collected with pt away results are pending.  She was prescribed flagyl and diflucan.  Encouraged partner to get treated as well for bv.  F/u with pcp for recheck if sx worsen.  Urine cx also pending.  Doubt uti - sx most consistent with vaginal infections.  Urine sample was clean catch.        Burgess Amor, [place] 11/09/11 2154

## 2011-11-09 NOTE — ED Notes (Signed)
Pt c/o vaginal itching and discharge since Saturday. Pt denies any n/v/d.

## 2011-11-10 LAB — GC/CHLAMYDIA PROBE AMP, GENITAL: GC Probe Amp, Genital: NEGATIVE

## 2011-11-10 NOTE — ED Provider Notes (Signed)
Medical screening examination/treatment/procedure(s) were performed by non-physician practitioner and as supervising physician I was immediately available for consultation/collaboration.   Alessia Gonsalez, MD 11/10/11 0643 

## 2011-11-11 LAB — URINE CULTURE: Colony Count: >=100000

## 2011-11-12 NOTE — ED Notes (Signed)
+   Urine Chart sent to EDP office for review. 

## 2011-11-14 NOTE — ED Notes (Signed)
Prescription called in to [facility] [place], at [phone] for bactrim ds one po bid x3 days per [facility], [facility].

## 2011-11-14 NOTE — ED Notes (Signed)
Chart returned from EDP office  With order written by Trixie Dredge for Bactrim DS 1 tab po BID x 3 days needs to be called to pharmacy.

## 2012-01-20 ENCOUNTER — Encounter (HOSPITAL_COMMUNITY): Payer: Self-pay | Admitting: *Deleted

## 2012-01-20 ENCOUNTER — Emergency Department (HOSPITAL_COMMUNITY)
Admission: EM | Admit: 2012-01-20 | Discharge: 2012-01-20 | Disposition: A | Discharge status: Home / Self Care | Payer: Self-pay | Attending: Emergency Medicine | Admitting: Emergency Medicine

## 2012-01-20 DIAGNOSIS — Z87891 Personal history of nicotine dependence: Secondary | ICD-10-CM | POA: Insufficient documentation

## 2012-01-20 DIAGNOSIS — R51 Headache: Secondary | ICD-10-CM | POA: Insufficient documentation

## 2012-01-20 MED ORDER — KETOROLAC TROMETHAMINE 60 MG/2ML IM SOLN
60.0000 mg | Freq: Once | INTRAMUSCULAR | Status: AC
  Administered 2012-01-20: 60 mg via INTRAMUSCULAR
  Filled 2012-01-20: qty 2

## 2012-01-20 MED ORDER — DIPHENHYDRAMINE HCL 25 MG PO CAPS
25.0000 mg | ORAL_CAPSULE | Freq: Once | ORAL | Status: AC
  Administered 2012-01-20: 25 mg via ORAL
  Filled 2012-01-20: qty 1

## 2012-01-20 MED ORDER — ONDANSETRON 4 MG PO TBDP
4.0000 mg | ORAL_TABLET | Freq: Once | ORAL | Status: AC
  Administered 2012-01-20: 4 mg via ORAL
  Filled 2012-01-20: qty 1

## 2012-01-20 MED ORDER — PREDNISONE 50 MG PO TABS
60.0000 mg | ORAL_TABLET | Freq: Once | ORAL | Status: AC
  Administered 2012-01-20: 60 mg via ORAL
  Filled 2012-01-20: qty 1

## 2012-01-20 NOTE — ED Notes (Signed)
Pt here for generalized HA which began a week ago and has not been associated with any sensitivity to light or sound, no nausea or vomiting, no weakness or numbness.  Not associated with any trauma.  Pt is not in any distress on arrival, rates pain 5/10

## 2012-01-20 NOTE — ED Provider Notes (Signed)
History     CSN: [id number]  Arrival date & time 01/20/12  0128   First MD Initiated Contact with Patient 01/20/12 0155      Chief Complaint  Patient presents with  . Headache    (Consider location/radiation/quality/duration/timing/severity/associated sxs/prior treatment) HPI Kathryn Harris is a 22 y.o. female who presents to the Emergency Department complaining of headache present for one week with no relief from aspirin. Headache has been on both sides, over the top of the head, to the back of the head, behind her eyes, into her neck. Each day it is in a different place. Denies vision changes, hearing changes, trouble speaking or swallowing, dizziness, fever, chills, nausea, vomiting.  PCP Dr. Sudie Bailey    .History reviewed. No pertinent past medical history.  History reviewed. No pertinent past surgical history.  No family history on file.  History  Substance Use Topics  . Smoking status: Former Smoker    Quit date: 12/01/2007  . Smokeless tobacco: Former Neurosurgeon    Quit date: 07/01/2007  . Alcohol Use: 1.8 oz/week    3 Shots of liquor per week     Comment: occasionally    OB History    Grav Para Term Preterm Abortions TAB SAB Ect Mult Living                  Review of Systems  Constitutional: Negative for fever.       10 Systems reviewed and are negative for acute change except as noted in the HPI.  HENT: Negative for congestion.   Eyes: Negative for discharge and redness.  Respiratory: Negative for cough and shortness of breath.   Cardiovascular: Negative for chest pain.  Gastrointestinal: Negative for vomiting and abdominal pain.  Musculoskeletal: Negative for back pain.  Skin: Negative for rash.  Neurological: Positive for headaches. Negative for syncope and numbness.  Psychiatric/Behavioral:       No behavior change.    Allergies  Review of patient's allergies indicates no known allergies.  Home Medications   Current Outpatient Rx  Name   Route  Sig  Dispense  Refill  . METRONIDAZOLE 500 MG PO TABS   Oral   Take 1 tablet (500 mg total) by mouth 2 (two) times daily.   14 tablet   0     BP 137/74  Pulse 60  Temp 98.6 F (37 C) (Oral)  Resp 20  Ht 5\' 6"  (1.676 m)  Wt 265 lb (120.203 kg)  BMI 42.77 kg/m2  SpO2 100%  LMP 01/12/2012  Physical Exam  Nursing note and vitals reviewed. Constitutional: She is oriented to person, place, and time.       Awake, alert, nontoxic appearance.  HENT:  Head: Normocephalic and atraumatic.  Right Ear: External ear normal.  Left Ear: External ear normal.  Nose: Nose normal.  Mouth/Throat: Oropharynx is clear and moist.  Eyes: Conjunctivae normal and EOM are normal. Pupils are equal, round, and reactive to light. Right eye exhibits no discharge. Left eye exhibits no discharge.  Neck: Normal range of motion. Neck supple.  Cardiovascular: Normal heart sounds and intact distal pulses.   Pulmonary/Chest: Effort normal and breath sounds normal. She exhibits no tenderness.  Abdominal: Soft. Bowel sounds are normal. There is no tenderness. There is no rebound.  Musculoskeletal: Normal range of motion. She exhibits no tenderness.       Baseline ROM, no obvious new focal weakness.  Lymphadenopathy:    She has no cervical adenopathy.  Neurological: She  is alert and oriented to person, place, and time. She has normal reflexes. No cranial nerve deficit. Coordination normal.       Mental status and motor strength appears baseline for patient and situation.  Skin: No rash noted.  Psychiatric: She has a normal mood and affect.    ED Course  Procedures (including critical care time)    MDM  Patient presents with a headache that has been present for a week. Given benadryl, Toradol, prednisone and Zofran with relief. Pt feels improved after observation and/or treatment in ED.Pt stable in ED with no significant deterioration in condition.The patient appears reasonably screened and/or  stabilized for discharge and I doubt any other medical condition or other [facility] requiring further screening, evaluation, or treatment in the ED at this time prior to discharge.  MDM Reviewed: nursing note and vitals           Nicoletta Dress. Colon Branch, MD 01/20/12 [phone]

## 2012-07-12 ENCOUNTER — Emergency Department (HOSPITAL_COMMUNITY)
Admission: EM | Admit: 2012-07-12 | Discharge: 2012-07-13 | Disposition: A | Discharge status: Home / Self Care | Payer: Self-pay | Attending: Emergency Medicine | Admitting: Emergency Medicine

## 2012-07-12 ENCOUNTER — Encounter (HOSPITAL_COMMUNITY): Payer: Self-pay

## 2012-07-12 DIAGNOSIS — Z3202 Encounter for pregnancy test, result negative: Secondary | ICD-10-CM | POA: Insufficient documentation

## 2012-07-12 DIAGNOSIS — R109 Unspecified abdominal pain: Secondary | ICD-10-CM | POA: Insufficient documentation

## 2012-07-12 DIAGNOSIS — Z87891 Personal history of nicotine dependence: Secondary | ICD-10-CM | POA: Insufficient documentation

## 2012-07-12 DIAGNOSIS — N946 Dysmenorrhea, unspecified: Secondary | ICD-10-CM | POA: Insufficient documentation

## 2012-07-12 LAB — POCT PREGNANCY, URINE: Preg Test, Ur: NEGATIVE

## 2012-07-12 NOTE — ED Notes (Signed)
Patient states that she started cramping today and she went to the bathroom and a blood clot with what looked like tissue came out and she does not know if she is having a miscarriage. LMP was in June

## 2012-07-13 NOTE — ED Provider Notes (Signed)
History    CSN: [id number] Arrival date & time 07/12/12  2137  First MD Initiated Contact with Patient 07/12/12 2301     Chief Complaint  Patient presents with  . Vaginal Bleeding  . Possible Pregnancy   (Consider location/radiation/quality/duration/timing/severity/associated sxs/prior Treatment) HPI Comments: Patient presents to the emergency department with a complaint of a cramping sensation in her lower abdomen. Patient states that earlier today she went to the bathroom and passed" what looked like a blood clot". The patient is concerned for possible miscarriage. She states that her last menstrual cycle was in June, but she also states that she is having unprotected intercourse and she is not on any form of birth control. There's been no fever or chills. There's been no excessive bleeding. Patient states that she had some spotting on her last period which is a little different than her usual cycle. She's not had any injury to her pelvis. There's been no operations or procedures involving the pelvis or female organs.  Patient is a 22 y.o. female presenting with pregnancy problem. The history is provided by the patient.  Possible Pregnancy Patient reports no abdominal pain.  Associated symptoms include no dysuria and no shortness of breath.   History reviewed. No pertinent past medical history. History reviewed. No pertinent past surgical history. No family history on file. History  Substance Use Topics  . Smoking status: Former Smoker    Quit date: 12/01/2007  . Smokeless tobacco: Former Neurosurgeon    Quit date: 07/01/2007  . Alcohol Use: 1.8 oz/week    3 Shots of liquor per week     Comment: occasionally   OB History   Grav Para Term Preterm Abortions TAB SAB Ect Mult Living                 Review of Systems  Constitutional: Negative for activity change.       All ROS Neg except as noted in HPI  HENT: Negative for nosebleeds and neck pain.   Eyes: Negative for photophobia  and discharge.  Respiratory: Negative for cough, shortness of breath and wheezing.   Cardiovascular: Negative for chest pain and palpitations.  Gastrointestinal: Negative for abdominal pain and blood in stool.  Genitourinary: Negative for dysuria, frequency and hematuria.  Musculoskeletal: Negative for back pain and arthralgias.  Skin: Negative.   Neurological: Negative for dizziness, seizures and speech difficulty.  Psychiatric/Behavioral: Negative for hallucinations and confusion.    Allergies  Review of patient's allergies indicates no known allergies.  Home Medications  No current outpatient prescriptions on file. BP 128/69  Pulse 103  Temp(Src) 98.9 F (37.2 C) (Oral)  Resp 18  Ht 5\' 6"  (1.676 m)  Wt 261 lb (118.389 kg)  BMI 42.15 kg/m2  SpO2 100%  LMP 06/05/2012 Physical Exam  Nursing note and vitals reviewed. Constitutional: She is oriented to person, place, and time. She appears well-developed and well-nourished.  Non-toxic appearance.  HENT:  Head: Normocephalic.  Right Ear: Tympanic membrane and external ear normal.  Left Ear: Tympanic membrane and external ear normal.  Eyes: EOM and lids are normal. Pupils are equal, round, and reactive to light.  Neck: Normal range of motion. Neck supple. Carotid bruit is not present.  Cardiovascular: Normal rate, regular rhythm, normal heart sounds, intact distal pulses and normal pulses.   Pulmonary/Chest: Breath sounds normal. No respiratory distress.  Abdominal: Soft. Bowel sounds are normal. There is no tenderness. There is no guarding.  Mild to moderate cramping pain in the  mid pubis and left lower abdomen.  Musculoskeletal: Normal range of motion.  Lymphadenopathy:       Head (right side): No submandibular adenopathy present.       Head (left side): No submandibular adenopathy present.    She has no cervical adenopathy.  Neurological: She is alert and oriented to person, place, and time. She has normal strength. No  cranial nerve deficit or sensory deficit.  Skin: Skin is warm and dry.  Psychiatric: She has a normal mood and affect. Her speech is normal.    ED Course  Procedures (including critical care time) Labs Reviewed  HCG, QUANTITATIVE, PREGNANCY  POCT PREGNANCY, URINE   No results found. 1. Dysmenorrhea     MDM  I have reviewed nursing notes, vital signs, and all appropriate lab and imaging results for this patient. Vital signs are well within normal limits with exception of the pulse being slightly elevated at 103 on admission upon my examination the pulse rate had come down to 96. Urine pregnancy is negative. Quantitative pregnancy test (serum) is less than 1.  The results were given to the patient and it was explained to both the urine and blood tests were negative for pregnancy. Patient advised to see her GYN physician for evaluation concerning her cramping and the lower abdomen discomfort. Patient acknowledges understanding of the lab results and the plan.    Kathie Dike, PA-C 07/13/12 0117

## 2012-07-24 NOTE — ED Provider Notes (Signed)
Medical screening examination/treatment/procedure(s) were performed by non-physician practitioner and as supervising physician I was immediately available for consultation/collaboration.  Nicoletta Dress. Colon Branch, MD 07/24/12 [phone]

## 2013-07-15 ENCOUNTER — Emergency Department (HOSPITAL_COMMUNITY): Payer: Self-pay

## 2013-07-15 ENCOUNTER — Emergency Department (HOSPITAL_COMMUNITY)
Admission: EM | Admit: 2013-07-15 | Discharge: 2013-07-15 | Disposition: A | Discharge status: Home / Self Care | Payer: Self-pay | Attending: Emergency Medicine | Admitting: Emergency Medicine

## 2013-07-15 ENCOUNTER — Encounter (HOSPITAL_COMMUNITY): Payer: Self-pay | Admitting: Emergency Medicine

## 2013-07-15 DIAGNOSIS — L42 Pityriasis rosea: Secondary | ICD-10-CM | POA: Insufficient documentation

## 2013-07-15 DIAGNOSIS — R21 Rash and other nonspecific skin eruption: Secondary | ICD-10-CM | POA: Insufficient documentation

## 2013-07-15 DIAGNOSIS — Z87891 Personal history of nicotine dependence: Secondary | ICD-10-CM | POA: Insufficient documentation

## 2013-07-15 DIAGNOSIS — R109 Unspecified abdominal pain: Secondary | ICD-10-CM | POA: Insufficient documentation

## 2013-07-15 DIAGNOSIS — Z3202 Encounter for pregnancy test, result negative: Secondary | ICD-10-CM | POA: Insufficient documentation

## 2013-07-15 LAB — URINALYSIS, ROUTINE W REFLEX MICROSCOPIC
Bilirubin Urine: NEGATIVE
GLUCOSE, UA: NEGATIVE mg/dL
Hgb urine dipstick: NEGATIVE
KETONES UR: NEGATIVE mg/dL
LEUKOCYTES UA: NEGATIVE
NITRITE: NEGATIVE
Protein, ur: 100 mg/dL — AB
Specific Gravity, Urine: 1.02 (ref 1.005–1.030)
Urobilinogen, UA: 0.2 mg/dL (ref 0.0–1.0)
pH: 6 (ref 5.0–8.0)

## 2013-07-15 LAB — URINE MICROSCOPIC-ADD ON

## 2013-07-15 LAB — POC URINE PREG, ED: Preg Test, Ur: NEGATIVE

## 2013-07-15 MED ORDER — IBUPROFEN 800 MG PO TABS: 800.0000 mg | ORAL_TABLET | Freq: Three times a day (TID) | ORAL | Status: DC | PRN

## 2013-07-15 NOTE — ED Notes (Signed)
Pain in left side x 1 week and intermittent left arm numbness. Pt also been having generalized rash x 2 weeks

## 2013-07-15 NOTE — ED Provider Notes (Signed)
TIME SEEN: 8:00 PM  CHIEF COMPLAINT: Left flank pain, rash  HPI: Patient is a 23 year old female with no significant past medical history who presents emergency department with one week of left-sided flank pain that she describes as a sharp pain that is worse with movement. She denies a history of injury to this area. No dysuria or hematuria. No shortness of breath or chest pain. No cough. No history of kidney stone.  She also complains of a diffuse pruritic rash has been present for 2 weeks. No new soaps, lotions, detergents, medications or foods. She states it started in one small spot on her chest and has spread. She does have a few lesions on her arms and legs but mostly in on her trunk. No lesions in her mouth. No rash on her palms or soles. No tick bites. No fever. No blisters.  Per nursing note, patient also complains of intermittent left arm numbness. She denies this complaint to me. Denies any focal weakness. No bowel or bladder incontinence.  States she began looking her symptoms up online at home and became very worried so she came to the emergency department.  ROS: See HPI Constitutional: no fever  Eyes: no drainage  ENT: no runny nose   Cardiovascular:  no chest pain  Resp: no SOB  GI: no vomiting GU: no dysuria Integumentary: no rash  Allergy: no hives  Musculoskeletal: no leg swelling  Neurological: no slurred speech ROS otherwise negative  PAST MEDICAL HISTORY/PAST SURGICAL HISTORY:  History reviewed. No pertinent past medical history.  MEDICATIONS:  Prior to Admission medications   Not on File    ALLERGIES:  No Known Allergies  SOCIAL HISTORY:  History  Substance Use Topics  . Smoking status: Former Smoker    Quit date: 12/01/2007  . Smokeless tobacco: Former NeurosurgeonUser    Quit date: 07/01/2007  . Alcohol Use: 1.8 oz/week    3 Shots of liquor per week     Comment: occasionally    FAMILY HISTORY: No family history on file.  EXAM: BP 118/64  Pulse 76   Temp(Src) 98.1 F (36.7 C) (Oral)  Resp 18  Ht 5\' 6"  (1.676 m)  Wt 270 lb (122.471 kg)  BMI 43.60 kg/m2  SpO2 100%  LMP 06/26/2013 CONSTITUTIONAL: Alert and oriented and responds appropriately to questions. Well-appearing; well-nourished, smiling, pleasant HEAD: Normocephalic EYES: Conjunctivae clear, PERRL ENT: normal nose; no rhinorrhea; moist mucous membranes; pharynx without lesions noted NECK: Supple, no meningismus, no LAD  CARD: RRR; S1 and S2 appreciated; no murmurs, no clicks, no rubs, no gallops RESP: Normal chest excursion without splinting or tachypnea; breath sounds clear and equal bilaterally; no wheezes, no rhonchi, no rales, palpation over the left chest wall without ecchymosis or crepitus or deformity ABD/GI: Normal bowel sounds; non-distended; soft, non-tender, no rebound, no guarding BACK:  The back appears normal without lesions; patient has some tenderness to palpation over the left flank with no bony deformity, no CVA tenderness EXT: Normal ROM in all joints; non-tender to palpation; no edema; normal capillary refill; no cyanosis    SKIN: Normal color for age and race; warm; diffuse flat macular rash mostly on her trunk but also scattered on her extremities with no erythema or warmth or drainage; no induration or fluctuance, no lesions on her palms or soles, and no lesions in her mucous membranes NEURO: Moves all extremities equally, sensation to light touch intact diffusely, cranial nerves II through XII intact, normal gait PSYCH: The patient's mood and manner are  appropriate. Grooming and personal hygiene are appropriate.  MEDICAL DECISION MAKING: Patient here with left-sided flank pain that is likely muscle strain she is tender to palpation over her left ribs. Will obtain altered series although no history of trauma. We'll also obtain urinalysis to evaluate for hematuria or signs of infection. Patient denies wanting pain medication at this time. Her rash is consistent  with pityriasis rosea. Have discussed with patient she can use over-the-counter Benadryl as needed for itching. Have discussed supportive care instructions. Have also discussed with her that I do not feel there is any life-threatening illness present.  ED PROGRESS: Patient's chest x-ray shows no acute fracture, pneumonia. She is PERC negative.  Urine shows no sign of infection or hematuria to suggest pyelonephritis or kidney stone. I suspect that she has a thoracic muscle strain. Will discharge with prescription for ibuprofen. Discussed supportive care instructions and return precautions. Patient verbalizes understanding and is comfortable with plan.     Layla Maw Laresa Oshiro, DO 07/15/13 2145

## 2013-07-15 NOTE — Discharge Instructions (Signed)
Pityriasis Rosea Pityriasis rosea is a rash which is probably caused by a virus. It generally starts as a scaly, red patch on the trunk (the area of the body that a t-shirt would cover) but does not appear on sun exposed areas. The rash is usually preceded by an initial larger spot called the "herald patch" a week or more before the rest of the rash appears. Generally within one to two days the rash appears rapidly on the trunk, upper arms, and sometimes the upper legs. The rash usually appears as flat, oval patches of scaly pink color. The rash can also be raised and one is able to feel it with a finger. The rash can also be finely crinkled and may slough off leaving a ring of scale around the spot. Sometimes a mild sore throat is present with the rash. It usually affects children and young adults in the spring and autumn. Women are more frequently affected than men. TREATMENT  Pityriasis rosea is a self-limited condition. This means it goes away within 4 to 8 weeks without treatment. The spots may persist for several months, especially in darker-colored skin after the rash has resolved and healed. Benadryl and steroid creams may be used if itching is a problem. SEEK MEDICAL CARE IF:   Your rash does not go away or persists longer than three months.  You develop fever and joint pain.  You develop severe headache and confusion.  You develop breathing difficulty, vomiting and/or extreme weakness. Document Released: 01/25/2001 Document Revised: 03/13/2011 Document Reviewed: 02/14/2008 [facility] Patient Information 2015 [place], [place]. This information is not intended to replace advice given to you by your health care provider. Make sure you discuss any questions you have with your health care provider.   Thoracic Strain You have injured the muscles or tendons that attach to the upper part of your back behind your chest. This injury is called a thoracic strain, thoracic sprain, or mid-back strain.    CAUSES  The cause of thoracic strain varies. A less severe injury involves pulling a muscle or tendon without tearing it. A more severe injury involves tearing (rupturing) a muscle or tendon. With less severe injuries, there may be little loss of strength. Sometimes, there are breaks (fractures) in the bones to which the muscles are attached. These fractures are rare, unless there was a direct hit (trauma) or you have weak bones due to osteoporosis or age. Longstanding strains may be caused by overuse or improper form during certain movements. Obesity can also increase your risk for back injuries. Sudden strains may occur due to injury or not warming up properly before exercise. Often, there is no obvious cause for a thoracic strain. SYMPTOMS  The main symptom is pain, especially with movement, such as during exercise. DIAGNOSIS  Your caregiver can usually tell what is wrong by taking an X-ray and doing a physical exam. TREATMENT   Physical therapy may be helpful for recovery. Your caregiver can give you exercises to do or refer you to a physical therapist after your pain improves.  After your pain improves, strengthening and conditioning programs appropriate for your sport or occupation may be helpful.  Always warm up before physical activities or athletics. Stretching after physical activity may also help.  Certain over-the-counter medicines may also help. Ask your caregiver if there are medicines that would help you. If this is your first thoracic strain injury, proper care and proper healing time before starting activities should prevent long-term problems. Torn ligaments and tendons  require as long to heal as broken bones. Average healing times may be only 1 week for a mild strain. For torn muscles and tendons, healing time may be up to 6 weeks to 2 months. HOME CARE INSTRUCTIONS   Apply ice to the injured area. Ice massages may also be used as directed.  Put ice in a plastic bag.  Place  a towel between your skin and the bag.  Leave the ice on for 15-20 minutes, 03-04 times a day, for the first 2 days.  Only take over-the-counter or prescription medicines for pain, discomfort, or fever as directed by your caregiver.  Keep your appointments for physical therapy if this was prescribed.  Use wraps and back braces as instructed. SEEK IMMEDIATE MEDICAL CARE IF:   You have an increase in bruising, swelling, or pain.  Your pain has not improved with medicines.  You develop new shortness of breath, chest pain, or fever.  Problems seem to be getting worse rather than better. MAKE SURE YOU:   Understand these instructions.  Will watch your condition.  Will get help right away if you are not doing well or get worse. Document Released: 03/11/2003 Document Revised: 03/13/2011 Document Reviewed: 02/04/2010 [facility] Patient Information 2015 [place], [place]. This information is not intended to replace advice given to you by your health care provider. Make sure you discuss any questions you have with your health care provider.

## 2013-10-31 ENCOUNTER — Emergency Department (HOSPITAL_COMMUNITY)
Admission: EM | Admit: 2013-10-31 | Discharge: 2013-10-31 | Disposition: A | Discharge status: Home / Self Care | Payer: Self-pay | Attending: Emergency Medicine | Admitting: Emergency Medicine

## 2013-10-31 ENCOUNTER — Encounter (HOSPITAL_COMMUNITY): Payer: Self-pay | Admitting: Emergency Medicine

## 2013-10-31 DIAGNOSIS — N760 Acute vaginitis: Secondary | ICD-10-CM | POA: Insufficient documentation

## 2013-10-31 DIAGNOSIS — Z87891 Personal history of nicotine dependence: Secondary | ICD-10-CM | POA: Insufficient documentation

## 2013-10-31 DIAGNOSIS — N390 Urinary tract infection, site not specified: Secondary | ICD-10-CM | POA: Insufficient documentation

## 2013-10-31 DIAGNOSIS — B9689 Other specified bacterial agents as the cause of diseases classified elsewhere: Secondary | ICD-10-CM

## 2013-10-31 DIAGNOSIS — Z3202 Encounter for pregnancy test, result negative: Secondary | ICD-10-CM | POA: Insufficient documentation

## 2013-10-31 DIAGNOSIS — J45909 Unspecified asthma, uncomplicated: Secondary | ICD-10-CM | POA: Insufficient documentation

## 2013-10-31 HISTORY — DX: Unspecified asthma, uncomplicated: J45.909

## 2013-10-31 LAB — URINE MICROSCOPIC-ADD ON

## 2013-10-31 LAB — URINALYSIS, ROUTINE W REFLEX MICROSCOPIC
Bilirubin Urine: NEGATIVE
Glucose, UA: NEGATIVE mg/dL
KETONES UR: NEGATIVE mg/dL
NITRITE: NEGATIVE
PH: 5.5 (ref 5.0–8.0)
Specific Gravity, Urine: 1.025 (ref 1.005–1.030)
UROBILINOGEN UA: 0.2 mg/dL (ref 0.0–1.0)

## 2013-10-31 LAB — WET PREP, GENITAL
Trich, Wet Prep: NONE SEEN
YEAST WET PREP: NONE SEEN

## 2013-10-31 LAB — PREGNANCY, URINE: Preg Test, Ur: NEGATIVE

## 2013-10-31 MED ORDER — CEPHALEXIN 500 MG PO CAPS: 500.0000 mg | ORAL_CAPSULE | Freq: Two times a day (BID) | ORAL | Status: DC

## 2013-10-31 MED ORDER — METRONIDAZOLE 500 MG PO TABS: 500.0000 mg | ORAL_TABLET | Freq: Two times a day (BID) | ORAL | Status: DC

## 2013-10-31 NOTE — Discharge Instructions (Signed)
As discussed the other tests will come back in 3 days and you will get a call if it comes back abnormal Bacterial Vaginosis Bacterial vaginosis is a vaginal infection that occurs when the normal balance of bacteria in the vagina is disrupted. It results from an overgrowth of certain bacteria. This is the most common vaginal infection in women of childbearing age. Treatment is important to prevent complications, especially in pregnant women, as it can cause a premature delivery. CAUSES  Bacterial vaginosis is caused by an increase in harmful bacteria that are normally present in smaller amounts in the vagina. Several different kinds of bacteria can cause bacterial vaginosis. However, the reason that the condition develops is not fully understood. RISK FACTORS Certain activities or behaviors can put you at an increased risk of developing bacterial vaginosis, including:  Having a new sex partner or multiple sex partners.  Douching.  Using an intrauterine device (IUD) for contraception. Women do not get bacterial vaginosis from toilet seats, bedding, swimming pools, or contact with objects around them. SIGNS AND SYMPTOMS  Some women with bacterial vaginosis have no signs or symptoms. Common symptoms include:  Grey vaginal discharge.  A fishlike odor with discharge, especially after sexual intercourse.  Itching or burning of the vagina and vulva.  Burning or pain with urination. DIAGNOSIS  Your health care provider will take a medical history and examine the vagina for signs of bacterial vaginosis. A sample of vaginal fluid may be taken. Your health care provider will look at this sample under a microscope to check for bacteria and abnormal cells. A vaginal pH test may also be done.  TREATMENT  Bacterial vaginosis may be treated with antibiotic medicines. These may be given in the form of a pill or a vaginal cream. A second round of antibiotics may be prescribed if the condition comes back  after treatment.  HOME CARE INSTRUCTIONS   Only take over-the-counter or prescription medicines as directed by your health care provider.  If antibiotic medicine was prescribed, take it as directed. Make sure you finish it even if you start to feel better.  Do not have sex until treatment is completed.  Tell all sexual partners that you have a vaginal infection. They should see their health care provider and be treated if they have problems, such as a mild rash or itching.  Practice safe sex by using condoms and only having one sex partner. SEEK MEDICAL CARE IF:   Your symptoms are not improving after 3 days of treatment.  You have increased discharge or pain.  You have a fever. MAKE SURE YOU:   Understand these instructions.  Will watch your condition.  Will get help right away if you are not doing well or get worse. FOR MORE INFORMATION  Centers for Disease Control and Prevention, Division of STD Prevention: SolutionApps.co.zawww.cdc.gov/std American Sexual Health Association (ASHA): www.ashastd.org  Document Released: 12/19/2004 Document Revised: 10/09/2012 Document Reviewed: 07/31/2012 [facility]Exit[facility] Patient Information 2015 [place], [place]. This information is not intended to replace advice given to you by your health care provider. Make sure you discuss any questions you have with your health care provider.  Urinary Tract Infection Urinary tract infections (UTIs) can develop anywhere along your urinary tract. Your urinary tract is your body's drainage system for removing wastes and extra water. Your urinary tract includes two kidneys, two ureters, a bladder, and a urethra. Your kidneys are a pair of bean-shaped organs. Each kidney is about the size of your fist. They are located below  your ribs, one on each side of your spine. CAUSES Infections are caused by microbes, which are microscopic organisms, including fungi, viruses, and bacteria. These organisms are so small that they can only be seen  through a microscope. Bacteria are the microbes that most commonly cause UTIs. SYMPTOMS  Symptoms of UTIs may vary by age and gender of the patient and by the location of the infection. Symptoms in young women typically include a frequent and intense urge to urinate and a painful, burning feeling in the bladder or urethra during urination. Older women and men are more likely to be tired, shaky, and weak and have muscle aches and abdominal pain. A fever may mean the infection is in your kidneys. Other symptoms of a kidney infection include pain in your back or sides below the ribs, nausea, and vomiting. DIAGNOSIS To diagnose a UTI, your caregiver will ask you about your symptoms. Your caregiver also will ask to provide a urine sample. The urine sample will be tested for bacteria and white blood cells. White blood cells are made by your body to help fight infection. TREATMENT  Typically, UTIs can be treated with medication. Because most UTIs are caused by a bacterial infection, they usually can be treated with the use of antibiotics. The choice of antibiotic and length of treatment depend on your symptoms and the type of bacteria causing your infection. HOME CARE INSTRUCTIONS  If you were prescribed antibiotics, take them exactly as your caregiver instructs you. Finish the medication even if you feel better after you have only taken some of the medication.  Drink enough water and fluids to keep your urine clear or pale yellow.  Avoid caffeine, tea, and carbonated beverages. They tend to irritate your bladder.  Empty your bladder often. Avoid holding urine for long periods of time.  Empty your bladder before and after sexual intercourse.  After a bowel movement, women should cleanse from front to back. Use each tissue only once. SEEK MEDICAL CARE IF:   You have back pain.  You develop a fever.  Your symptoms do not begin to resolve within 3 days. SEEK IMMEDIATE MEDICAL CARE IF:   You have  severe back pain or lower abdominal pain.  You develop chills.  You have nausea or vomiting.  You have continued burning or discomfort with urination. MAKE SURE YOU:   Understand these instructions.  Will watch your condition.  Will get help right away if you are not doing well or get worse. Document Released: 09/28/2004 Document Revised: 06/20/2011 Document Reviewed: 01/27/2011 [facility] Point[facility] Patient Information 2015 [place], [place]. This information is not intended to replace advice given to you by your health care provider. Make sure you discuss any questions you have with your health care provider.

## 2013-10-31 NOTE — ED Notes (Signed)
Pt reports increased urinary frequency, vaginal discharge/itching since last Sunday. Pt denies any abdominal pain. nad noted.

## 2013-10-31 NOTE — ED Notes (Signed)
MD at bedside. 
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
## 2013-10-31 NOTE — ED Provider Notes (Signed)
Medical screening examination/treatment/procedure(s) were performed by non-physician practitioner and as supervising physician I was immediately available for consultation/collaboration.   EKG Interpretation None       Brandis Matsuura, MD 10/31/13 1335 

## 2013-11-01 LAB — GC/CHLAMYDIA PROBE AMP
CT Probe RNA: NEGATIVE
GC Probe RNA: NEGATIVE

## 2013-11-02 LAB — URINE CULTURE
COLONY COUNT: NO GROWTH
CULTURE: NO GROWTH

## 2014-09-30 ENCOUNTER — Encounter (HOSPITAL_COMMUNITY): Payer: Self-pay | Admitting: Emergency Medicine

## 2014-09-30 ENCOUNTER — Emergency Department (HOSPITAL_COMMUNITY): Payer: Self-pay

## 2014-09-30 ENCOUNTER — Emergency Department (HOSPITAL_COMMUNITY)
Admission: EM | Admit: 2014-09-30 | Discharge: 2014-09-30 | Disposition: A | Discharge status: Home / Self Care | Payer: Self-pay | Attending: Emergency Medicine | Admitting: Emergency Medicine

## 2014-09-30 DIAGNOSIS — Z87891 Personal history of nicotine dependence: Secondary | ICD-10-CM | POA: Insufficient documentation

## 2014-09-30 DIAGNOSIS — G935 Compression of brain: Secondary | ICD-10-CM

## 2014-09-30 DIAGNOSIS — Y9389 Activity, other specified: Secondary | ICD-10-CM | POA: Insufficient documentation

## 2014-09-30 DIAGNOSIS — S0003XA Contusion of scalp, initial encounter: Secondary | ICD-10-CM

## 2014-09-30 DIAGNOSIS — Z23 Encounter for immunization: Secondary | ICD-10-CM | POA: Insufficient documentation

## 2014-09-30 DIAGNOSIS — Z792 Long term (current) use of antibiotics: Secondary | ICD-10-CM | POA: Insufficient documentation

## 2014-09-30 DIAGNOSIS — Y9289 Other specified places as the place of occurrence of the external cause: Secondary | ICD-10-CM | POA: Insufficient documentation

## 2014-09-30 DIAGNOSIS — Y998 Other external cause status: Secondary | ICD-10-CM | POA: Insufficient documentation

## 2014-09-30 DIAGNOSIS — R011 Cardiac murmur, unspecified: Secondary | ICD-10-CM | POA: Insufficient documentation

## 2014-09-30 DIAGNOSIS — S0001XA Abrasion of scalp, initial encounter: Secondary | ICD-10-CM

## 2014-09-30 DIAGNOSIS — J45909 Unspecified asthma, uncomplicated: Secondary | ICD-10-CM | POA: Insufficient documentation

## 2014-09-30 MED ORDER — TETANUS-DIPHTH-ACELL PERTUSSIS 5-2.5-18.5 LF-MCG/0.5 IM SUSP
INTRAMUSCULAR | Status: AC
  Filled 2014-09-30: qty 0.5

## 2014-09-30 MED ORDER — TETANUS-DIPHTH-ACELL PERTUSSIS 5-2.5-18.5 LF-MCG/0.5 IM SUSP
0.5000 mL | Freq: Once | INTRAMUSCULAR | Status: AC
  Administered 2014-09-30: 0.5 mL via INTRAMUSCULAR
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
## 2014-09-30 NOTE — Discharge Instructions (Signed)
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 Radiologist recommends getting an MRI scan to make sure that it is benign.  Assault, General Assault includes any behavior, whether intentional or reckless, which results in bodily injury to another person and/or damage to property. Included in this would be any behavior, intentional or reckless, that by its nature would be understood (interpreted) by a reasonable person as intent to harm another person or to damage his/her property. Threats may be oral or written. They may be communicated through regular mail, computer, fax, or phone. These threats may be direct or implied. FORMS OF ASSAULT INCLUDE:  Physically assaulting a person. This includes physical threats to inflict physical harm as well as:  Slapping.  Hitting.  Poking.  Kicking.  Punching.  Pushing.  Arson.  Sabotage.  Equipment vandalism.  Damaging or destroying property.  Throwing or hitting objects.  Displaying a weapon or an object that appears to be a weapon in a threatening manner.  Carrying a firearm of any kind.  Using a weapon to harm someone.  Using greater physical size/strength to intimidate another.  Making intimidating or threatening gestures.  Bullying.  Hazing.  Intimidating, threatening, hostile, or abusive language directed toward another person.  It communicates the intention to engage in violence against that person. And it leads a reasonable person to expect that violent behavior may occur.  Stalking another person. IF IT HAPPENS AGAIN:  Immediately call for emergency help (911 in [place]  If someone poses clear and immediate danger to you, seek legal authorities to have a protective or restraining order put in place.  Less threatening assaults can at least be reported to authorities. STEPS TO TAKE IF A SEXUAL ASSAULT HAS HAPPENED  Go to an area of safety. This may include a shelter or staying with a friend. Stay away from  the area where you have been attacked. A large percentage of sexual assaults are caused by a friend, relative or associate.  If medications were given by your caregiver, take them as directed for the full length of time prescribed.  Only take over-the-counter or prescription medicines for pain, discomfort, or fever as directed by your caregiver.  If you have come in contact with a sexual disease, find out if you are to be tested again. If your caregiver is concerned about the HIV/AIDS virus, he/she may require you to have continued testing for several months.  For the protection of your privacy, test results can not be given over the phone. Make sure you receive the results of your test. If your test results are not back during your visit, make an appointment with your caregiver to find out the results. Do not assume everything is normal if you have not heard from your caregiver or the medical facility. It is important for you to follow up on all of your test results.  File appropriate papers with authorities. This is important in all assaults, even if it has occurred in a family or by a friend. SEEK MEDICAL CARE IF:  You have new problems because of your injuries.  You have problems that may be because of the medicine you are taking, such as:  Rash.  Itching.  Swelling.  Trouble breathing.  You develop belly (abdominal) pain, feel sick to your stomach (nausea) or are vomiting.  You begin to run a temperature.  You need supportive care or referral to a rape crisis center. These are centers with trained personnel who can help you get through  this ordeal. SEEK IMMEDIATE MEDICAL CARE IF:  You are afraid of being threatened, beaten, or abused. In [place] call 911.  You receive new injuries related to abuse.  You develop severe pain in any area injured in the assault or have any change in your condition that concerns you.  You faint or lose consciousness.  You develop chest pain or  shortness of breath. Document Released: 12/19/2004 Document Revised: 03/13/2011 Document Reviewed: 08/07/2007 [facility] Patient Information 2015 [place], [place]. This information is not intended to replace advice given to you by your health care provider. Make sure you discuss any questions you have with your health care provider.   Abrasion An abrasion is a cut or scrape of the skin. Abrasions do not extend through all layers of the skin and most heal within 10 days. It is important to care for your abrasion properly to prevent infection. CAUSES  Most abrasions are caused by falling on, or gliding across, the ground or other surface. When your skin rubs on something, the outer and inner layer of skin rubs off, causing an abrasion. DIAGNOSIS  Your caregiver will be able to diagnose an abrasion during a physical exam.  TREATMENT  Your treatment depends on how large and deep the abrasion is. Generally, your abrasion will be cleaned with water and a mild soap to remove any dirt or debris. An antibiotic ointment may be put over the abrasion to prevent an infection. A bandage (dressing) may be wrapped around the abrasion to keep it from getting dirty.  You may need a tetanus shot if:  You cannot remember when you had your last tetanus shot.  You have never had a tetanus shot.  The injury broke your skin. If you get a tetanus shot, your arm may swell, get red, and feel warm to the touch. This is common and not a problem. If you need a tetanus shot and you choose not to have one, there is a rare chance of getting tetanus. Sickness from tetanus can be serious.  HOME CARE INSTRUCTIONS   If a dressing was applied, change it at least once a day or as directed by your caregiver. If the bandage sticks, soak it off with warm water.   Wash the area with water and a mild soap to remove all the ointment 2 times a day. Rinse off the soap and pat the area dry with a clean towel.   Reapply any ointment as  directed by your caregiver. This will help prevent infection and keep the bandage from sticking. Use gauze over the wound and under the dressing to help keep the bandage from sticking.   Change your dressing right away if it becomes wet or dirty.   Only take over-the-counter or prescription medicines for pain, discomfort, or fever as directed by your caregiver.   Follow up with your caregiver within 24-48 hours for a wound check, or as directed. If you were not given a wound-check appointment, look closely at your abrasion for redness, swelling, or pus. These are signs of infection. SEEK IMMEDIATE MEDICAL CARE IF:   You have increasing pain in the wound.   You have redness, swelling, or tenderness around the wound.   You have pus coming from the wound.   You have a fever or persistent symptoms for more than 2-3 days.  You have a fever and your symptoms suddenly get worse.  You have a bad smell coming from the wound or dressing.  MAKE SURE YOU:  Understand these instructions.  Will watch your condition.  Will get help right away if you are not doing well or get worse. Document Released: 09/28/2004 Document Revised: 12/06/2011 Document Reviewed: 11/22/2010 [facility] Patient Information 2015 [place], [place]. This information is not intended to replace advice given to you by your health care provider. Make sure you discuss any questions you have with your health care provider.  Contusion A contusion is a deep bruise. Contusions are the result of an injury that caused bleeding under the skin. The contusion may turn blue, purple, or yellow. Minor injuries will give you a painless contusion, but more severe contusions may stay painful and swollen for a few weeks.  CAUSES  A contusion is usually caused by a blow, trauma, or direct force to an area of the body. SYMPTOMS   Swelling and redness of the injured area.  Bruising of the injured area.  Tenderness and soreness of the  injured area.  Pain. DIAGNOSIS  The diagnosis can be made by taking a history and physical exam. An X-ray, CT scan, or MRI may be needed to determine if there were any associated injuries, such as fractures. TREATMENT  Specific treatment will depend on what area of the body was injured. In general, the best treatment for a contusion is resting, icing, elevating, and applying cold compresses to the injured area. Over-the-counter medicines may also be recommended for pain control. Ask your caregiver what the best treatment is for your contusion. HOME CARE INSTRUCTIONS   Put ice on the injured area.  Put ice in a plastic bag.  Place a towel between your skin and the bag.  Leave the ice on for 15-20 minutes, 3-4 times a day, or as directed by your health care provider.  Only take over-the-counter or prescription medicines for pain, discomfort, or fever as directed by your caregiver. Your caregiver may recommend avoiding anti-inflammatory medicines (aspirin, ibuprofen, and naproxen) for 48 hours because these medicines may increase bruising.  Rest the injured area.  If possible, elevate the injured area to reduce swelling. SEEK IMMEDIATE MEDICAL CARE IF:   You have increased bruising or swelling.  You have pain that is getting worse.  Your swelling or pain is not relieved with medicines. MAKE SURE YOU:   Understand these instructions.  Will watch your condition.  Will get help right away if you are not doing well or get worse. Document Released: 09/28/2004 Document Revised: 12/24/2012 Document Reviewed: 10/24/2010 [facility] Patient Information 2015 [place], [place]. This information is not intended to replace advice given to you by your health care provider. Make sure you discuss any questions you have with your health care provider.

## 2014-10-01 ENCOUNTER — Emergency Department (HOSPITAL_COMMUNITY)
Admission: EM | Admit: 2014-10-01 | Discharge: 2014-10-01 | Disposition: A | Discharge status: Home / Self Care | Payer: Self-pay | Attending: Emergency Medicine | Admitting: Emergency Medicine

## 2014-10-01 ENCOUNTER — Encounter (HOSPITAL_COMMUNITY): Payer: Self-pay

## 2014-10-01 DIAGNOSIS — R51 Headache: Secondary | ICD-10-CM | POA: Insufficient documentation

## 2014-10-01 DIAGNOSIS — J45909 Unspecified asthma, uncomplicated: Secondary | ICD-10-CM | POA: Insufficient documentation

## 2014-10-01 DIAGNOSIS — R55 Syncope and collapse: Secondary | ICD-10-CM | POA: Insufficient documentation

## 2014-10-01 DIAGNOSIS — Z87891 Personal history of nicotine dependence: Secondary | ICD-10-CM | POA: Insufficient documentation

## 2014-10-01 DIAGNOSIS — Z792 Long term (current) use of antibiotics: Secondary | ICD-10-CM | POA: Insufficient documentation

## 2014-10-01 DIAGNOSIS — Z87828 Personal history of other (healed) physical injury and trauma: Secondary | ICD-10-CM | POA: Insufficient documentation

## 2014-10-01 NOTE — Discharge Instructions (Signed)
As discussed, your evaluation today has been largely reassuring.  But, it is important that you monitor your condition carefully.  It is normal to have intermittent changes from normal mental status while recovering from head trauma.  However, if there are sustained changes, do not hesitate to return to the ED.  Otherwise, please follow-up with your physician for appropriate ongoing care, and to discuss the incidental findings on the CT scan performed this week.
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
## 2014-10-01 NOTE — ED Notes (Signed)
Pt contradicts self when describing what happened today.  States that she passed out twice but then states she did not lose consciousness.  States that her head was hurting so bad that she fell back onto the couch once and the bed another time.

## 2015-10-09 ENCOUNTER — Encounter (HOSPITAL_COMMUNITY): Payer: Self-pay | Admitting: *Deleted

## 2015-10-09 ENCOUNTER — Emergency Department (HOSPITAL_COMMUNITY)
Admission: EM | Admit: 2015-10-09 | Discharge: 2015-10-09 | Disposition: A | Discharge status: Home / Self Care | Payer: Self-pay | Attending: Emergency Medicine | Admitting: Emergency Medicine

## 2015-10-09 DIAGNOSIS — N730 Acute parametritis and pelvic cellulitis: Secondary | ICD-10-CM

## 2015-10-09 DIAGNOSIS — J45909 Unspecified asthma, uncomplicated: Secondary | ICD-10-CM | POA: Insufficient documentation

## 2015-10-09 DIAGNOSIS — D500 Iron deficiency anemia secondary to blood loss (chronic): Secondary | ICD-10-CM | POA: Insufficient documentation

## 2015-10-09 DIAGNOSIS — Z87891 Personal history of nicotine dependence: Secondary | ICD-10-CM | POA: Insufficient documentation

## 2015-10-09 DIAGNOSIS — N739 Female pelvic inflammatory disease, unspecified: Secondary | ICD-10-CM | POA: Insufficient documentation

## 2015-10-09 DIAGNOSIS — Z79899 Other long term (current) drug therapy: Secondary | ICD-10-CM | POA: Insufficient documentation

## 2015-10-09 LAB — URINALYSIS, ROUTINE W REFLEX MICROSCOPIC
Bilirubin Urine: NEGATIVE
Glucose, UA: NEGATIVE mg/dL
Leukocytes, UA: NEGATIVE
Nitrite: NEGATIVE
Protein, ur: 30 mg/dL — AB
SPECIFIC GRAVITY, URINE: 1.02 (ref 1.005–1.030)
pH: 6.5 (ref 5.0–8.0)

## 2015-10-09 LAB — CBC WITH DIFFERENTIAL/PLATELET
Basophils Absolute: 0 10*3/uL (ref 0.0–0.1)
Basophils Relative: 0 %
EOS ABS: 0.2 10*3/uL (ref 0.0–0.7)
EOS PCT: 2 %
HCT: 25.5 % — ABNORMAL LOW (ref 36.0–46.0)
HEMOGLOBIN: 7.1 g/dL — AB (ref 12.0–15.0)
LYMPHS ABS: 2 10*3/uL (ref 0.7–4.0)
Lymphocytes Relative: 23 %
MCH: 15.4 pg — ABNORMAL LOW (ref 26.0–34.0)
MCHC: 27.8 g/dL — AB (ref 30.0–36.0)
MCV: 55.2 fL — ABNORMAL LOW (ref 78.0–100.0)
MONO ABS: 0.6 10*3/uL (ref 0.1–1.0)
Monocytes Relative: 7 %
Neutro Abs: 5.7 10*3/uL (ref 1.7–7.7)
Neutrophils Relative %: 67 %
PLATELETS: 308 10*3/uL (ref 150–400)
RBC: 4.62 MIL/uL (ref 3.87–5.11)
RDW: 22.6 % — ABNORMAL HIGH (ref 11.5–15.5)
WBC: 8.4 10*3/uL (ref 4.0–10.5)

## 2015-10-09 LAB — COMPREHENSIVE METABOLIC PANEL
ALK PHOS: 70 U/L (ref 38–126)
ALT: 12 U/L — AB (ref 14–54)
AST: 14 U/L — AB (ref 15–41)
Albumin: 3.5 g/dL (ref 3.5–5.0)
Anion gap: 6 (ref 5–15)
BUN: 9 mg/dL (ref 6–20)
CHLORIDE: 107 mmol/L (ref 101–111)
CO2: 25 mmol/L (ref 22–32)
CREATININE: 0.95 mg/dL (ref 0.44–1.00)
Calcium: 8.5 mg/dL — ABNORMAL LOW (ref 8.9–10.3)
GFR calc Af Amer: >60 mL/min (ref >60)
GFR calc non Af Amer: >60 mL/min (ref >60)
GLUCOSE: 107 mg/dL — AB (ref 65–99)
Potassium: 3.6 mmol/L (ref 3.5–5.1)
SODIUM: 138 mmol/L (ref 135–145)
Total Bilirubin: 0.1 mg/dL — ABNORMAL LOW (ref 0.3–1.2)
Total Protein: 7.3 g/dL (ref 6.5–8.1)

## 2015-10-09 LAB — URINE MICROSCOPIC-ADD ON

## 2015-10-09 LAB — WET PREP, GENITAL
CLUE CELLS WET PREP: NONE SEEN
Sperm: NONE SEEN
Trich, Wet Prep: NONE SEEN
Yeast Wet Prep HPF POC: NONE SEEN

## 2015-10-09 LAB — I-STAT BETA HCG BLOOD, ED (MC, WL, AP ONLY)

## 2015-10-09 MED ORDER — FERROUS SULFATE 325 (65 FE) MG PO TABS: 325.0000 mg | ORAL_TABLET | Freq: Three times a day (TID) | ORAL | 0 refills | Status: DC

## 2015-10-09 MED ORDER — CEFTRIAXONE SODIUM 250 MG IJ SOLR
250.0000 mg | Freq: Once | INTRAMUSCULAR | Status: AC
  Administered 2015-10-09: 250 mg via INTRAMUSCULAR
  Filled 2015-10-09: qty 250

## 2015-10-09 MED ORDER — DOXYCYCLINE HYCLATE 100 MG PO TABS
100.0000 mg | ORAL_TABLET | Freq: Once | ORAL | Status: AC
  Administered 2015-10-09: 100 mg via ORAL
  Filled 2015-10-09: qty 1

## 2015-10-09 MED ORDER — AZITHROMYCIN 1 G PO PACK
1.0000 g | PACK | Freq: Once | ORAL | Status: AC
  Administered 2015-10-09: 1 g via ORAL
  Filled 2015-10-09: qty 1

## 2015-10-09 MED ORDER — DOXYCYCLINE HYCLATE 100 MG PO CAPS: 100.0000 mg | ORAL_CAPSULE | Freq: Two times a day (BID) | ORAL | 0 refills | Status: DC

## 2015-10-09 MED ORDER — KETOROLAC TROMETHAMINE 30 MG/ML IJ SOLN
30.0000 mg | Freq: Once | INTRAMUSCULAR | Status: AC
  Administered 2015-10-09: 30 mg via INTRAVENOUS
  Filled 2015-10-09: qty 1

## 2015-10-09 NOTE — Discharge Instructions (Signed)
Take ibuprofen or naproxen as needed for pain. You may supplement with acetaminophen if needed.

## 2015-10-09 NOTE — ED Notes (Signed)
Pt states that for the last few days she has noticed when she wakes up her hands and feet are swollen

## 2015-10-09 NOTE — ED Triage Notes (Signed)
Pt c/o generalized abdominal pain that started x 3 hours ago; pt denies any fever, n/v/d or dysuria

## 2015-10-09 NOTE — ED Provider Notes (Signed)
[facility]-EMERGENCY DEPT Provider Note   CSN: [id number] Arrival date & time: 10/09/15  0219     History   Chief Complaint Chief Complaint  Patient presents with  . Abdominal Pain    HPI Kathryn Harris is a 25 y.o. female. She has a history of asthma. She comes in with a three-hour history of suprapubic pain radiating to both flanks. She describes the pain as sharp and severe. She initially rated pain at 10/10 but it has subsided to 8/10. There is no associated nausea or vomiting. She denies fever, chills, sweats. She denies constipation or diarrhea. She denies dysuria. She has not taken any medication to help her pain. She is currently on her menses and nearly finished her menstrual flow. She does admit to having had some episodes of unprotected sex.  As a separate complaint, she has noted some mild swelling of her hands and feet over the last week. She notices it mostly when she wakes up.  The history is provided by the patient.    Past Medical History:  Diagnosis Date  . Asthma     There are no active problems to display for this patient.   No past surgical history on file.  OB History    No data available       Home Medications    Prior to Admission medications   Medication Sig Start Date End Date Taking? Authorizing Provider  cephALEXin (KEFLEX) 500 MG capsule Take 1 capsule (500 mg total) by mouth 2 (two) times daily. 10/31/13   Teressa LowerVrinda Pickering, NP  metroNIDAZOLE (FLAGYL) 500 MG tablet Take 1 tablet (500 mg total) by mouth 2 (two) times daily. 10/31/13   Teressa LowerVrinda Pickering, NP    Family History No family history on file.  Social History Social History  Substance Use Topics  . Smoking status: Former Smoker    Quit date: 12/01/2007  . Smokeless tobacco: Former NeurosurgeonUser    Quit date: 07/01/2007  . Alcohol use 1.8 oz/week    3 Shots of liquor per week     Comment: occasionally     Allergies   Review of patient's allergies indicates no known  allergies.   Review of Systems Review of Systems  All other systems reviewed and are negative.    Physical Exam Updated Vital Signs BP 129/76 (BP Location: Left Arm)   Pulse 71   Temp 98.1 F (36.7 C) (Oral)   Resp 20   Ht 5\' 5"  (1.651 m)   Wt 265 lb (120.2 kg)   LMP 10/03/2015   SpO2 100%   BMI 44.10 kg/m   Physical Exam  Nursing note and vitals reviewed.  Obese 25 year old female, resting comfortably and in no acute distress. Vital signs are normal. Oxygen saturation is 100%, which is normal. Head is normocephalic and atraumatic. PERRLA, EOMI. Oropharynx is clear. Neck is nontender and supple without adenopathy or JVD. Back is nontender in the midline. There is mild bilateral CVA tenderness. Lungs are clear without rales, wheezes, or rhonchi. Chest is nontender. Heart has regular rate and rhythm without murmur. Abdomen is soft, flat, with mild suprapubic tenderness. There is no rebound or guarding. There are no masses or hepatosplenomegaly and peristalsis is normoactive. Pelvic: Normal external female genitalia. Small amount of menstrual flow present in the vaginal vault. Cervix is closed. On bimanual exam, there is mild to moderate cervical motion tenderness. Fundus is normal size and position. There is moderate bilateral adnexal tenderness without adnexal masses. Extremities have trace  edema, full range of motion is present. Skin is warm and dry without rash. Neurologic: Mental status is normal, cranial nerves are intact, there are no motor or sensory deficits.  ED Treatments / Results  Labs (all labs ordered are listed, but only abnormal results are displayed) Labs Reviewed  WET PREP, GENITAL - Abnormal; Notable for the following:       Result Value   WBC, Wet Prep HPF POC RARE (*)    All other components within normal limits  COMPREHENSIVE METABOLIC PANEL - Abnormal; Notable for the following:    Glucose, Bld 107 (*)    Calcium 8.5 (*)    AST 14 (*)    ALT 12  (*)    Total Bilirubin 0.1 (*)    All other components within normal limits  CBC WITH DIFFERENTIAL/PLATELET - Abnormal; Notable for the following:    Hemoglobin 7.1 (*)    HCT 25.5 (*)    MCV 55.2 (*)    MCH 15.4 (*)    MCHC 27.8 (*)    RDW 22.6 (*)    All other components within normal limits  URINALYSIS, ROUTINE W REFLEX MICROSCOPIC (NOT AT [facility]) - Abnormal; Notable for the following:    Hgb urine dipstick LARGE (*)    Ketones, ur TRACE (*)    Protein, ur 30 (*)    All other components within normal limits  URINE MICROSCOPIC-ADD ON - Abnormal; Notable for the following:    Squamous Epithelial / LPF 0-5 (*)    Bacteria, UA FEW (*)    All other components within normal limits  RPR  HIV ANTIBODY (ROUTINE TESTING)  I-STAT BETA HCG BLOOD, ED ([facility], [facility], [facility] ONLY)  GC/CHLAMYDIA PROBE AMP ([place]) NOT AT [facility]   Procedures Procedures (including critical care time)  Medications Ordered in ED Medications  ketorolac (TORADOL) 30 MG/ML injection 30 mg (30 mg Intravenous Given 10/09/15 0256)  cefTRIAXone (ROCEPHIN) injection 250 mg (250 mg Intramuscular Given 10/09/15 0423)  azithromycin (ZITHROMAX) powder 1 g (1 g Oral Given 10/09/15 0423)  doxycycline (VIBRA-TABS) tablet 100 mg (100 mg Oral Given 10/09/15 0423)     Initial Impression / Assessment and Plan / ED Course  I have reviewed the triage vital signs and the nursing notes.  Pertinent labs & imaging results that were available during my care of the patient were reviewed by me and considered in my medical decision making (see chart for details).  Clinical Course   Pelvic pain of uncertain cause. Old records are reviewed, and she has no relevant past visits.  Pelvic exam seems most consistent with PID. Laboratory workup significant for anemia which appears to be iron deficiency anemia. She is given an injection of ceftriaxone and given oral azithromycin and doxycycline. She had reasonably good pain relief with ketorolac. Physes  over-the-counter naproxen or ibuprofen for pain. She is also given a prescription for iron supplements.  Final Clinical Impressions(s) / ED Diagnoses   Final diagnoses:  Acute pelvic inflammatory disease (PID)  Iron deficiency anemia due to chronic blood loss    New Prescriptions New Prescriptions   DOXYCYCLINE (VIBRAMYCIN) 100 MG CAPSULE    Take 1 capsule (100 mg total) by mouth 2 (two) times daily.   FERROUS SULFATE 325 (65 FE) MG TABLET    Take 1 tablet (325 mg total) by mouth 3 (three) times daily with meals.     Dione Booze, MD 10/09/15 [phone]

## 2015-10-10 LAB — HIV ANTIBODY (ROUTINE TESTING W REFLEX): HIV Screen 4th Generation wRfx: NONREACTIVE

## 2015-10-10 LAB — RPR: RPR: NONREACTIVE

## 2015-10-11 LAB — GC/CHLAMYDIA PROBE AMP (~~LOC~~) NOT AT ARMC
CHLAMYDIA, DNA PROBE: NEGATIVE
NEISSERIA GONORRHEA: NEGATIVE

## 2015-10-16 ENCOUNTER — Encounter (HOSPITAL_COMMUNITY): Payer: Self-pay

## 2015-10-16 ENCOUNTER — Emergency Department (HOSPITAL_COMMUNITY): Payer: Self-pay

## 2015-10-16 ENCOUNTER — Emergency Department (HOSPITAL_COMMUNITY)
Admission: EM | Admit: 2015-10-16 | Discharge: 2015-10-16 | Disposition: A | Discharge status: Home / Self Care | Payer: Self-pay | Attending: Emergency Medicine | Admitting: Emergency Medicine

## 2015-10-16 DIAGNOSIS — R200 Anesthesia of skin: Secondary | ICD-10-CM | POA: Insufficient documentation

## 2015-10-16 DIAGNOSIS — R51 Headache: Secondary | ICD-10-CM | POA: Insufficient documentation

## 2015-10-16 DIAGNOSIS — R0789 Other chest pain: Secondary | ICD-10-CM | POA: Insufficient documentation

## 2015-10-16 DIAGNOSIS — Z79899 Other long term (current) drug therapy: Secondary | ICD-10-CM | POA: Insufficient documentation

## 2015-10-16 DIAGNOSIS — R519 Headache, unspecified: Secondary | ICD-10-CM

## 2015-10-16 DIAGNOSIS — J45909 Unspecified asthma, uncomplicated: Secondary | ICD-10-CM | POA: Insufficient documentation

## 2015-10-16 DIAGNOSIS — R202 Paresthesia of skin: Secondary | ICD-10-CM | POA: Insufficient documentation

## 2015-10-16 DIAGNOSIS — Z87891 Personal history of nicotine dependence: Secondary | ICD-10-CM | POA: Insufficient documentation

## 2015-10-16 LAB — CBC WITH DIFFERENTIAL/PLATELET
HEMATOCRIT: 27.3 % — AB (ref 36.0–46.0)
Hemoglobin: 7.6 g/dL — ABNORMAL LOW (ref 12.0–15.0)
MCH: 15.4 pg — ABNORMAL LOW (ref 26.0–34.0)
MCHC: 27.8 g/dL — ABNORMAL LOW (ref 30.0–36.0)
MCV: 55.4 fL — ABNORMAL LOW (ref 78.0–100.0)
Platelets: 349 10*3/uL (ref 150–400)
RBC: 4.93 MIL/uL (ref 3.87–5.11)
RDW: 22.3 % — ABNORMAL HIGH (ref 11.5–15.5)
WBC: 7.9 10*3/uL (ref 4.0–10.5)

## 2015-10-16 LAB — BASIC METABOLIC PANEL
Anion gap: 7 (ref 5–15)
BUN: 10 mg/dL (ref 6–20)
CALCIUM: 8.3 mg/dL — AB (ref 8.9–10.3)
CO2: 22 mmol/L (ref 22–32)
CREATININE: 0.75 mg/dL (ref 0.44–1.00)
Chloride: 107 mmol/L (ref 101–111)
GFR calc Af Amer: >60 mL/min (ref >60)
Glucose, Bld: 83 mg/dL (ref 65–99)
Potassium: 3.4 mmol/L — ABNORMAL LOW (ref 3.5–5.1)
SODIUM: 136 mmol/L (ref 135–145)

## 2015-10-16 LAB — D-DIMER, QUANTITATIVE (NOT AT ARMC)

## 2015-10-16 LAB — TROPONIN I

## 2015-10-16 MED ORDER — METOCLOPRAMIDE HCL 5 MG/ML IJ SOLN
10.0000 mg | Freq: Once | INTRAMUSCULAR | Status: AC
  Administered 2015-10-16: 10 mg via INTRAVENOUS
  Filled 2015-10-16: qty 2

## 2015-10-16 MED ORDER — DEXAMETHASONE SODIUM PHOSPHATE 10 MG/ML IJ SOLN
10.0000 mg | Freq: Once | INTRAMUSCULAR | Status: AC
  Administered 2015-10-16: 10 mg via INTRAVENOUS
  Filled 2015-10-16: qty 1

## 2015-10-16 MED ORDER — NAPROXEN 500 MG PO TABS: ORAL_TABLET | ORAL | 0 refills | Status: DC

## 2015-10-16 MED ORDER — DIPHENHYDRAMINE HCL 50 MG/ML IJ SOLN
25.0000 mg | Freq: Once | INTRAMUSCULAR | Status: AC
  Administered 2015-10-16: 25 mg via INTRAVENOUS
  Filled 2015-10-16: qty 1

## 2015-10-16 MED ORDER — SODIUM CHLORIDE 0.9 % IV BOLUS (SEPSIS)
1000.0000 mL | Freq: Once | INTRAVENOUS | Status: AC
  Administered 2015-10-16: 1000 mL via INTRAVENOUS
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
## 2015-10-16 NOTE — Discharge Instructions (Signed)
Consider seeing a neurologist about your chronic headaches. Especially now that you're having the numbness in your right upper extremity. Please call Dr. Cherly Hensenonquah's office to get an appointment. Take the naproxen for your chest pain and it may also help if your headaches. Recheck if he feel like getting worse instead of better.

## 2015-10-16 NOTE — ED Triage Notes (Signed)
Pt reports intermittent numbness to her right arm x several days, states she has also had a headache x 2 weeks.

## 2016-02-03 ENCOUNTER — Encounter: Payer: Self-pay | Admitting: Obstetrics & Gynecology

## 2016-02-21 ENCOUNTER — Encounter: Payer: Self-pay | Admitting: Obstetrics & Gynecology

## 2017-07-03 ENCOUNTER — Emergency Department (HOSPITAL_COMMUNITY)
Admission: EM | Admit: 2017-07-03 | Discharge: 2017-07-03 | Disposition: A | Discharge status: Home / Self Care | Payer: Self-pay | Attending: Emergency Medicine | Admitting: Emergency Medicine

## 2017-07-03 ENCOUNTER — Encounter (HOSPITAL_COMMUNITY): Payer: Self-pay | Admitting: Emergency Medicine

## 2017-07-03 DIAGNOSIS — J018 Other acute sinusitis: Secondary | ICD-10-CM | POA: Insufficient documentation

## 2017-07-03 DIAGNOSIS — J45909 Unspecified asthma, uncomplicated: Secondary | ICD-10-CM | POA: Insufficient documentation

## 2017-07-03 DIAGNOSIS — Z79899 Other long term (current) drug therapy: Secondary | ICD-10-CM | POA: Insufficient documentation

## 2017-07-03 DIAGNOSIS — H9201 Otalgia, right ear: Secondary | ICD-10-CM | POA: Insufficient documentation

## 2017-07-03 DIAGNOSIS — F17200 Nicotine dependence, unspecified, uncomplicated: Secondary | ICD-10-CM | POA: Insufficient documentation

## 2017-07-03 MED ORDER — IBUPROFEN 800 MG PO TABS
800.0000 mg | ORAL_TABLET | Freq: Once | ORAL | Status: AC
  Administered 2017-07-03: 800 mg via ORAL
  Filled 2017-07-03: qty 1

## 2017-07-03 MED ORDER — FEXOFENADINE-PSEUDOEPHED ER 60-120 MG PO TB12: 1.0000 | ORAL_TABLET | Freq: Two times a day (BID) | ORAL | 0 refills | Status: DC

## 2017-07-03 MED ORDER — PSEUDOEPHEDRINE HCL 60 MG PO TABS
60.0000 mg | ORAL_TABLET | Freq: Once | ORAL | Status: AC
  Administered 2017-07-03: 60 mg via ORAL
  Filled 2017-07-03: qty 1

## 2017-07-03 MED ORDER — PREDNISONE 20 MG PO TABS
40.0000 mg | ORAL_TABLET | Freq: Once | ORAL | Status: AC
  Administered 2017-07-03: 40 mg via ORAL
  Filled 2017-07-03: qty 2

## 2017-07-03 MED ORDER — DEXAMETHASONE 4 MG PO TABS: 4.0000 mg | ORAL_TABLET | Freq: Two times a day (BID) | ORAL | 0 refills | Status: DC

## 2017-07-03 MED ORDER — IBUPROFEN 600 MG PO TABS: 600.0000 mg | ORAL_TABLET | Freq: Four times a day (QID) | ORAL | 0 refills | Status: DC

## 2017-07-03 NOTE — Discharge Instructions (Addendum)
Your vital signs are within normal limits.  Your examination shows some fluid behind the eardrum on the right.  You have a sinus infection with congestion.  Please increase fluids.  Please use 600 mg of ibuprofen with each meal.  Please use Decadron 2 times daily with a meal.  Please use Allegra-D every 12 hours.

## 2017-07-03 NOTE — ED Provider Notes (Signed)
[facility] EMERGENCY DEPARTMENT Provider Note   CSN: [id number] Arrival date & time: 07/03/17  1411     History   Chief Complaint Chief Complaint  Patient presents with  . Otalgia    HPI Kathryn Harris is a 27 y.o. female.  Patient reports increasing pain and pressure in the right ear.  Patient states she has tried some over-the-counter medications without success.  The problem is getting worse and she is tired of dealing with it.  The history is provided by the patient.  Otalgia  This is a new problem. The current episode started more than 1 week ago. There is pain in the right ear. The problem occurs hourly. The problem has been gradually worsening. There has been no fever. The pain is moderate. Associated symptoms include headaches and rhinorrhea. Pertinent negatives include no ear discharge, no abdominal pain, no diarrhea, no vomiting, no neck pain, no cough and no rash.    Past Medical History:  Diagnosis Date  . Asthma     There are no active problems to display for this patient.   History reviewed. No pertinent surgical history.   OB History   None      Home Medications    Prior to Admission medications   Medication Sig Start Date End Date Taking? Authorizing Provider  dexamethasone (DECADRON) 4 MG tablet Take 1 tablet (4 mg total) by mouth 2 (two) times daily with a meal. 07/03/17   Ivery Quale, PA-C  doxycycline (VIBRAMYCIN) 100 MG capsule Take 1 capsule (100 mg total) by mouth 2 (two) times daily. 10/09/15   Dione Booze, MD  ferrous sulfate 325 (65 FE) MG tablet Take 1 tablet (325 mg total) by mouth 3 (three) times daily with meals. 10/09/15   Dione Booze, MD  fexofenadine-pseudoephedrine (ALLEGRA-D) 60-120 MG 12 hr tablet Take 1 tablet by mouth every 12 (twelve) hours. 07/03/17   Ivery Quale, PA-C  ibuprofen (ADVIL,MOTRIN) 600 MG tablet Take 1 tablet (600 mg total) by mouth 4 (four) times daily. 07/03/17   Ivery Quale, PA-C  naproxen (NAPROSYN)  500 MG tablet Take 1 po BID with food prn pain 10/16/15   Devoria Albe, MD    Family History History reviewed. No pertinent family history.  Social History Social History   Tobacco Use  . Smoking status: Current Every Day Smoker    Last attempt to quit: 12/01/2007    Years since quitting: 9.5  . Smokeless tobacco: Former Neurosurgeon    Quit date: 07/01/2007  Substance Use Topics  . Alcohol use: Yes    Alcohol/week: 1.8 oz    Types: 3 Shots of liquor per week    Comment: occasionally  . Drug use: Yes    Types: Marijuana     Allergies   Patient has no known allergies.   Review of Systems Review of Systems  Constitutional: Negative for activity change.       All ROS Neg except as noted in HPI  HENT: Positive for ear pain and rhinorrhea. Negative for ear discharge and nosebleeds.   Eyes: Negative for photophobia and discharge.  Respiratory: Negative for cough, shortness of breath and wheezing.   Cardiovascular: Negative for chest pain and palpitations.  Gastrointestinal: Negative for abdominal pain, blood in stool, diarrhea and vomiting.  Genitourinary: Negative for dysuria, frequency and hematuria.  Musculoskeletal: Negative for arthralgias, back pain and neck pain.  Skin: Negative.  Negative for rash.  Neurological: Positive for headaches. Negative for dizziness, seizures and speech difficulty.  Psychiatric/Behavioral: Negative for confusion and hallucinations.     Physical Exam Updated Vital Signs BP 118/77 (BP Location: Right Arm)   Pulse (!) 57   Temp 97.8 F (36.6 C) (Oral)   Resp 15   Ht 5\' 5"  (1.651 m)   Wt 122.5 kg (270 lb)   LMP 06/23/2017   SpO2 100%   BMI 44.93 kg/m   Physical Exam  Constitutional: She is oriented to person, place, and time. She appears well-developed and well-nourished.  Non-toxic appearance.  HENT:  Head: Normocephalic.  Right Ear: Tympanic membrane and external ear normal.  Left Ear: Tympanic membrane and external ear normal.  There  are no pre-or postauricular nodes appreciated.  There is no foreign body of the external auditory canal on the right or the left.  The left tympanic membrane is normal.  The right shows fluid behind the drum.  There is mild increased redness of the posterior pharynx.  There is nasal congestion and sinus congestion present.  Eyes: Pupils are equal, round, and reactive to light. EOM and lids are normal.  Neck: Normal range of motion. Neck supple. Carotid bruit is not present.  Cardiovascular: Normal rate, regular rhythm, normal heart sounds, intact distal pulses and normal pulses.  Pulmonary/Chest: Breath sounds normal. No respiratory distress.  Abdominal: Soft. Bowel sounds are normal. There is no tenderness. There is no guarding.  Musculoskeletal: Normal range of motion.  Lymphadenopathy:       Head (right side): No submandibular adenopathy present.       Head (left side): No submandibular adenopathy present.    She has no cervical adenopathy.  Neurological: She is alert and oriented to person, place, and time. She has normal strength. No cranial nerve deficit or sensory deficit.  Skin: Skin is warm and dry.  Psychiatric: She has a normal mood and affect. Her speech is normal.  Nursing note and vitals reviewed.    ED Treatments / Results  Labs (all labs ordered are listed, but only abnormal results are displayed) Labs Reviewed - No data to display  EKG None  Radiology No results found.  Procedures Procedures (including critical care time)  Medications Ordered in ED Medications  predniSONE (DELTASONE) tablet 40 mg (has no administration in time range)  pseudoephedrine (SUDAFED) tablet 60 mg (has no administration in time range)  ibuprofen (ADVIL,MOTRIN) tablet 800 mg (has no administration in time range)     Initial Impression / Assessment and Plan / ED Course  I have reviewed the triage vital signs and the nursing notes.  Pertinent labs & imaging results that were  available during my care of the patient were reviewed by me and considered in my medical decision making (see chart for details).      Final Clinical Impressions(s) / ED Diagnoses  MDM  Vital signs within normal limits.  Pulse oximetry is 100% on room air.  Within normal limits by my interpretation.  The patient has been dealing with this for about 2 to 3 weeks now, it is getting progressively worse.  No fever or chills reported.  No blood from the ears.  The examination suggest sinusitis, and right greater than left ear pain.  The patient will be treated with steroid, decongestant, and ibuprofen.  The patient is to increase fluids.  Patient is to follow-up with Dr. Sudie BaileyKnowlton in the office if not improving.  Patient acknowledges understanding of these instructions.   Final diagnoses:  Other subacute sinusitis  Otalgia of right ear  ED Discharge Orders        Ordered    dexamethasone (DECADRON) 4 MG tablet  2 times daily with meals     07/03/17 1526    fexofenadine-pseudoephedrine (ALLEGRA-D) 60-120 MG 12 hr tablet  Every 12 hours     07/03/17 1526    ibuprofen (ADVIL,MOTRIN) 600 MG tablet  4 times daily     07/03/17 1526       Ivery Quale, PA-C 07/03/17 1537    Mancel Bale, MD 07/03/17 [phone]

## 2017-07-03 NOTE — ED Triage Notes (Signed)
Patient complaining of right earache x 2 weeks.

## 2017-07-28 ENCOUNTER — Emergency Department (HOSPITAL_COMMUNITY): Payer: Self-pay

## 2017-07-28 ENCOUNTER — Emergency Department (HOSPITAL_COMMUNITY)
Admission: EM | Admit: 2017-07-28 | Discharge: 2017-07-28 | Disposition: A | Discharge status: Home / Self Care | Payer: Self-pay | Attending: Emergency Medicine | Admitting: Emergency Medicine

## 2017-07-28 ENCOUNTER — Encounter (HOSPITAL_COMMUNITY): Payer: Self-pay | Admitting: Emergency Medicine

## 2017-07-28 DIAGNOSIS — R55 Syncope and collapse: Secondary | ICD-10-CM

## 2017-07-28 DIAGNOSIS — J45909 Unspecified asthma, uncomplicated: Secondary | ICD-10-CM | POA: Insufficient documentation

## 2017-07-28 DIAGNOSIS — Z79899 Other long term (current) drug therapy: Secondary | ICD-10-CM | POA: Insufficient documentation

## 2017-07-28 LAB — I-STAT BETA HCG BLOOD, ED (MC, WL, AP ONLY): I-stat hCG, quantitative: <5 m[IU]/mL (ref <5)

## 2017-07-28 LAB — BASIC METABOLIC PANEL
ANION GAP: 4 — AB (ref 5–15)
BUN: 9 mg/dL (ref 6–20)
CO2: 24 mmol/L (ref 22–32)
Calcium: 8.4 mg/dL — ABNORMAL LOW (ref 8.9–10.3)
Chloride: 109 mmol/L (ref 98–111)
Creatinine, Ser: 0.9 mg/dL (ref 0.44–1.00)
Glucose, Bld: 94 mg/dL (ref 70–99)
Potassium: 4 mmol/L (ref 3.5–5.1)
Sodium: 137 mmol/L (ref 135–145)

## 2017-07-28 LAB — CBC WITH DIFFERENTIAL/PLATELET
BASOS ABS: 0 10*3/uL (ref 0.0–0.1)
Basophils Relative: 1 %
EOS ABS: 0.3 10*3/uL (ref 0.0–0.7)
Eosinophils Relative: 4 %
HEMATOCRIT: 31 % — AB (ref 36.0–46.0)
Hemoglobin: 9.1 g/dL — ABNORMAL LOW (ref 12.0–15.0)
LYMPHS PCT: 23 %
Lymphs Abs: 1.5 10*3/uL (ref 0.7–4.0)
MCH: 17.2 pg — ABNORMAL LOW (ref 26.0–34.0)
MCHC: 29.4 g/dL — ABNORMAL LOW (ref 30.0–36.0)
MCV: 58.6 fL — ABNORMAL LOW (ref 78.0–100.0)
Monocytes Absolute: 0.4 10*3/uL (ref 0.1–1.0)
Monocytes Relative: 7 %
Neutro Abs: 4.3 10*3/uL (ref 1.7–7.7)
Neutrophils Relative %: 65 %
PLATELETS: 292 10*3/uL (ref 150–400)
RBC: 5.29 MIL/uL — AB (ref 3.87–5.11)
RDW: 22.3 % — AB (ref 11.5–15.5)
WBC: 6.5 10*3/uL (ref 4.0–10.5)

## 2017-07-28 LAB — TROPONIN I: Troponin I: <0.03 ng/mL (ref <0.03)

## 2017-07-28 LAB — D-DIMER, QUANTITATIVE: D-Dimer, Quant: <0.27 ug/mL-FEU (ref 0.00–0.50)

## 2017-07-28 NOTE — ED Notes (Signed)
Pt states her head is hurting at this time. RN notified.

## 2017-07-28 NOTE — ED Triage Notes (Signed)
Pt reports she was standing outside talking and passed out and fell on the sidewalk.  States she had a headache prior to fall.  Mild lower back pain after fall.  Denies striking head.

## 2017-07-28 NOTE — Discharge Instructions (Addendum)
No driving or exertion this weekend Drink plenty of fluids Recheck with your doctor next week Please begin iron supplements

## 2017-07-28 NOTE — ED Provider Notes (Addendum)
[facility] EMERGENCY DEPARTMENT Provider Note   CSN: [id number] Arrival date & time: 07/28/17  1523     History   Chief Complaint Chief Complaint  Patient presents with  . Loss of Consciousness    HPI Kathryn Harris is a 27 y.o. female.  HPI  27 year old female presents today complaining of syncope.  She states she was standing outside talking to a family friend when she became somewhat lightheaded.  She then passed out.  Family members reports that she was not responsive for approximately 6 to 8 minutes.  Her mother states that he seemed to gasp a couple times and there.  When she woke up she was somewhat confused.  She now appears to be back to baseline.  She has no history of seizures.  She does not have any bruising or bleeding in her mouth or loss of urine.  She denies any chest pain, dyspnea, lightheadedness.  She did have some headache before she fell and feels like she has some headache now from striking the back of her head.  She has a history of anemia.  She is not sure what her last blood count was.  She has not been having any exertional dyspnea or weakness.  She denies any recent illness, cough, abdominal pain, nausea, vomiting or diarrhea.  She has been eating and drinking without difficulty and does not think she was dehydrated.  She denies similar symptoms symptoms in the past.  Past Medical History:  Diagnosis Date  . Asthma     There are no active problems to display for this patient.   History reviewed. No pertinent surgical history.   OB History   None      Home Medications    Prior to Admission medications   Medication Sig Start Date End Date Taking? Authorizing Provider  dexamethasone (DECADRON) 4 MG tablet Take 1 tablet (4 mg total) by mouth 2 (two) times daily with a meal. 07/03/17   Ivery Quale, PA-C  doxycycline (VIBRAMYCIN) 100 MG capsule Take 1 capsule (100 mg total) by mouth 2 (two) times daily. 10/09/15   Dione Booze, MD  ferrous  sulfate 325 (65 FE) MG tablet Take 1 tablet (325 mg total) by mouth 3 (three) times daily with meals. 10/09/15   Dione Booze, MD  fexofenadine-pseudoephedrine (ALLEGRA-D) 60-120 MG 12 hr tablet Take 1 tablet by mouth every 12 (twelve) hours. 07/03/17   Ivery Quale, PA-C  ibuprofen (ADVIL,MOTRIN) 600 MG tablet Take 1 tablet (600 mg total) by mouth 4 (four) times daily. 07/03/17   Ivery Quale, PA-C  naproxen (NAPROSYN) 500 MG tablet Take 1 po BID with food prn pain 10/16/15   Devoria Albe, MD    Family History History reviewed. No pertinent family history.  Social History Social History   Tobacco Use  . Smoking status: Never Smoker  . Smokeless tobacco: Former Engineer, water Use Topics  . Alcohol use: Yes    Alcohol/week: 1.8 oz    Types: 3 Shots of liquor per week    Comment: occasionally  . Drug use: Yes    Types: Marijuana     Allergies   Patient has no known allergies.   Review of Systems Review of Systems  All other systems reviewed and are negative.    Physical Exam Updated Vital Signs BP 121/87 (BP Location: Left Arm)   Pulse 88   Temp 97.7 F (36.5 C) (Oral)   Resp 18   Ht 1.651 m (5\' 5" )  Wt 120.7 kg (266 lb)   LMP 07/20/2017   SpO2 100%   BMI 44.26 kg/m   Physical Exam  Constitutional: She is oriented to person, place, and time. She appears well-developed and well-nourished. No distress.  HENT:  Head: Normocephalic and atraumatic.  Right Ear: External ear normal.  Left Ear: External ear normal.  Nose: Nose normal.  Eyes: Pupils are equal, round, and reactive to light. Conjunctivae and EOM are normal.  Neck: Normal range of motion. Neck supple.  Cardiovascular: Normal rate, regular rhythm and normal heart sounds.  Patient was tachycardic after sitting up during exam but heart rate again resolved to in the 80s  Pulmonary/Chest: Effort normal and breath sounds normal.  Abdominal: Soft. Bowel sounds are normal.  Musculoskeletal: Normal range of  motion.  Neurological: She is alert and oriented to person, place, and time. She displays normal reflexes. No cranial nerve deficit. She exhibits normal muscle tone. Coordination normal.  Skin: Skin is warm and dry.  Psychiatric: She has a normal mood and affect. Her behavior is normal. Thought content normal.  Nursing note and vitals reviewed.    ED Treatments / Results  Labs (all labs ordered are listed, but only abnormal results are displayed) Labs Reviewed  CBC WITH DIFFERENTIAL/PLATELET  BASIC METABOLIC PANEL  TROPONIN I  I-STAT BETA HCG BLOOD, ED ([facility], [facility], [facility] ONLY)    EKG EKG Interpretation  Date/Time:  Saturday July 28 2017 15:30:03 EDT Ventricular Rate:  90 PR Interval:    QRS Duration: 85 QT Interval:  362 QTC Calculation: 443 R Axis:   55 Text Interpretation:  Normal sinus rhythm Poor data quality No significant change since last tracing Confirmed by Margarita Grizzleay, Christiano Blandon [phone] on 07/28/2017 4:14:18 PM   Radiology Ct Head Wo Contrast  Result Date: 07/28/2017 CLINICAL DATA:  Syncope EXAM: CT HEAD WITHOUT CONTRAST TECHNIQUE: Contiguous axial images were obtained from the base of the skull through the vertex without intravenous contrast. COMPARISON:  10/16/2015 FINDINGS: Brain: No acute intracranial abnormality. Specifically, no hemorrhage, hydrocephalus, mass lesion, acute infarction, or significant intracranial injury. Vascular: No hyperdense vessel or unexpected calcification. Skull: No acute calvarial abnormality. Sinuses/Orbits: Visualized paranasal sinuses and mastoids clear. Orbital soft tissues unremarkable. Other: None IMPRESSION: No intracranial abnormality. Electronically Signed   By: Charlett NoseKevin  Dover M.D.   On: 07/28/2017 16:31    Procedures Procedures (including critical care time)  Medications Ordered in ED Medications - No data to display   Initial Impression / Assessment and Plan / ED Course  I have reviewed the triage vital signs and the nursing  notes.  Pertinent labs & imaging results that were available during my care of the patient were reviewed by me and considered in my medical decision making (see chart for details).    27 yo female with episode of syncope with reported unresponsive for 6-8 minutes.  She had some confusion after but is now back to baseline.  Patient with known ho anemia and hgb stable at 9. DDX-vasovagal  Hypotension  Seizure Patient could have had decreased bp with anemia but has been normotensive here without intervention Concern for seizure with extended episode and ?postictal but no obvious grand mal activity, no loss of bladder control EKG without changes worrisome for arrhythmia- no brugada, short pr, and normal ekg  Plan discharge to home with close follow up Patient and family advised of return precautions and voice understanding.   Final Clinical Impressions(s) / ED Diagnoses   Final diagnoses:  Syncope, unspecified syncope type  ED Discharge Orders    None       Margarita Grizzle, MD 07/29/17 1610    Margarita Grizzle, MD 07/29/17 [phone]

## 2017-11-08 ENCOUNTER — Encounter (HOSPITAL_COMMUNITY): Payer: Self-pay | Admitting: Emergency Medicine

## 2017-11-08 ENCOUNTER — Other Ambulatory Visit: Payer: Self-pay

## 2017-11-08 ENCOUNTER — Emergency Department (HOSPITAL_COMMUNITY)
Admission: EM | Admit: 2017-11-08 | Discharge: 2017-11-08 | Disposition: A | Discharge status: Home / Self Care | Payer: Medicaid Other | Attending: Emergency Medicine | Admitting: Emergency Medicine

## 2017-11-08 DIAGNOSIS — J45909 Unspecified asthma, uncomplicated: Secondary | ICD-10-CM | POA: Insufficient documentation

## 2017-11-08 DIAGNOSIS — Z79899 Other long term (current) drug therapy: Secondary | ICD-10-CM | POA: Insufficient documentation

## 2017-11-08 DIAGNOSIS — D500 Iron deficiency anemia secondary to blood loss (chronic): Secondary | ICD-10-CM | POA: Insufficient documentation

## 2017-11-08 DIAGNOSIS — N938 Other specified abnormal uterine and vaginal bleeding: Secondary | ICD-10-CM | POA: Insufficient documentation

## 2017-11-08 LAB — URINALYSIS, ROUTINE W REFLEX MICROSCOPIC
Bacteria, UA: NONE SEEN
Bilirubin Urine: NEGATIVE
Glucose, UA: NEGATIVE mg/dL
Ketones, ur: 5 mg/dL — AB
Leukocytes, UA: NEGATIVE
NITRITE: NEGATIVE
Protein, ur: 100 mg/dL — AB
RBC / HPF: >50 RBC/hpf — ABNORMAL HIGH (ref 0–5)
SPECIFIC GRAVITY, URINE: 1.027 (ref 1.005–1.030)
pH: 5 (ref 5.0–8.0)

## 2017-11-08 LAB — CBC WITH DIFFERENTIAL/PLATELET
Abs Immature Granulocytes: 0.01 10*3/uL (ref 0.00–0.07)
BASOS ABS: 0 10*3/uL (ref 0.0–0.1)
Basophils Relative: 0 %
EOS PCT: 3 %
Eosinophils Absolute: 0.2 10*3/uL (ref 0.0–0.5)
HCT: 30 % — ABNORMAL LOW (ref 36.0–46.0)
Hemoglobin: 8.2 g/dL — ABNORMAL LOW (ref 12.0–15.0)
IMMATURE GRANULOCYTES: 0 %
LYMPHS PCT: 24 %
Lymphs Abs: 1.6 10*3/uL (ref 0.7–4.0)
MCH: 16.7 pg — AB (ref 26.0–34.0)
MCHC: 27.3 g/dL — ABNORMAL LOW (ref 30.0–36.0)
MCV: 61.2 fL — ABNORMAL LOW (ref 80.0–100.0)
Monocytes Absolute: 0.5 10*3/uL (ref 0.1–1.0)
Monocytes Relative: 8 %
NEUTROS PCT: 65 %
NRBC: 0 % (ref 0.0–0.2)
Neutro Abs: 4.3 10*3/uL (ref 1.7–7.7)
PLATELETS: 261 10*3/uL (ref 150–400)
RBC: 4.9 MIL/uL (ref 3.87–5.11)
RDW: 20.8 % — ABNORMAL HIGH (ref 11.5–15.5)
WBC: 6.7 10*3/uL (ref 4.0–10.5)

## 2017-11-08 LAB — WET PREP, GENITAL
Clue Cells Wet Prep HPF POC: NONE SEEN
SPERM: NONE SEEN
Trich, Wet Prep: NONE SEEN
WBC WET PREP: NONE SEEN
Yeast Wet Prep HPF POC: NONE SEEN

## 2017-11-08 LAB — BASIC METABOLIC PANEL
ANION GAP: 7 (ref 5–15)
BUN: 7 mg/dL (ref 6–20)
CO2: 22 mmol/L (ref 22–32)
CREATININE: 0.76 mg/dL (ref 0.44–1.00)
Calcium: 8 mg/dL — ABNORMAL LOW (ref 8.9–10.3)
Chloride: 105 mmol/L (ref 98–111)
GFR calc non Af Amer: >60 mL/min (ref >60)
Glucose, Bld: 87 mg/dL (ref 70–99)
POTASSIUM: 3.6 mmol/L (ref 3.5–5.1)
SODIUM: 134 mmol/L — AB (ref 135–145)

## 2017-11-08 LAB — I-STAT BETA HCG BLOOD, ED (MC, WL, AP ONLY)

## 2017-11-08 MED ORDER — KETOROLAC TROMETHAMINE 30 MG/ML IJ SOLN
30.0000 mg | Freq: Once | INTRAMUSCULAR | Status: AC
  Administered 2017-11-08: 30 mg via INTRAVENOUS
  Filled 2017-11-08: qty 1

## 2017-11-08 MED ORDER — FERROUS SULFATE 325 (65 FE) MG PO TABS: 325.0000 mg | ORAL_TABLET | Freq: Every day | ORAL | 0 refills | Status: DC

## 2017-11-08 MED ORDER — SODIUM CHLORIDE 0.9 % IV BOLUS
1000.0000 mL | Freq: Once | INTRAVENOUS | Status: AC
  Administered 2017-11-08: 1000 mL via INTRAVENOUS

## 2017-11-08 MED ORDER — MEDROXYPROGESTERONE ACETATE 10 MG PO TABS: 10.0000 mg | ORAL_TABLET | Freq: Every day | ORAL | 0 refills | Status: DC

## 2017-11-08 NOTE — ED Triage Notes (Signed)
Pt c/o heavy vaginal bleeding and passing blood clots, pt states she was on her period from 10/14/17-10/19/17 and she started again 11/02/17 and is still bleeding, concerned since she is now passing clots, has not contacted OB

## 2017-11-08 NOTE — ED Provider Notes (Signed)
[facility] EMERGENCY DEPARTMENT Provider Note   CSN: [id number] Arrival date & time: 11/08/17  1956     History   Chief Complaint Chief Complaint  Patient presents with  . Vaginal Bleeding    HPI Kathryn Harris is a 27 y.o. female.  Pt presents to the ED today with heavy vaginal bleeding.  Pt said her period was 9 days late when it started on 10/13.  It lasted until the 18th, then it started again on 11/1 and it continues today.  She has been passing clots and is having lower abdominal pain.  She said she's had to change her pad every 15 minutes today.  Pt does not have an obgyn due to lack of insurance.     Past Medical History:  Diagnosis Date  . Asthma     There are no active problems to display for this patient.   History reviewed. No pertinent surgical history.   OB History   None      Home Medications    Prior to Admission medications   Medication Sig Start Date End Date Taking? Authorizing Provider  albuterol (PROVENTIL HFA;VENTOLIN HFA) 108 (90 Base) MCG/ACT inhaler Inhale 1-2 puffs into the lungs every 6 (six) hours as needed for wheezing or shortness of breath.   Yes [provider]  dexamethasone (DECADRON) 4 MG tablet Take 1 tablet (4 mg total) by mouth 2 (two) times daily with a meal. Patient not taking: Reported on 07/28/2017 07/03/17   Ivery Quale, PA-C  doxycycline (VIBRAMYCIN) 100 MG capsule Take 1 capsule (100 mg total) by mouth 2 (two) times daily. Patient not taking: Reported on 07/28/2017 10/09/15   Dione Booze, MD  ferrous sulfate 325 (65 FE) MG tablet Take 1 tablet (325 mg total) by mouth daily. 11/08/17   Jacalyn Lefevre, MD  medroxyPROGESTERone (PROVERA) 10 MG tablet Take 1 tablet (10 mg total) by mouth daily for 5 days. 11/08/17 11/13/17  Jacalyn Lefevre, MD    Family History History reviewed. No pertinent family history.  Social History Social History   Tobacco Use  . Smoking status: Never Smoker  . Smokeless tobacco:  Former Engineer, water Use Topics  . Alcohol use: Not Currently    Comment: occasionally  . Drug use: Not Currently    Types: Marijuana     Allergies   Patient has no known allergies.   Review of Systems Review of Systems  Gastrointestinal: Positive for abdominal pain.  Genitourinary: Positive for vaginal bleeding.  All other systems reviewed and are negative.    Physical Exam Updated Vital Signs BP 100/62 (BP Location: Right Arm)   Pulse 89   Temp 98.9 F (37.2 C) (Oral)   Resp 18   Ht 5\' 5"  (1.651 m)   Wt 120.2 kg   LMP 11/08/2017   SpO2 100%   BMI 44.10 kg/m   Physical Exam  Constitutional: She is oriented to person, place, and time. She appears well-developed and well-nourished.  HENT:  Head: Normocephalic and atraumatic.  Right Ear: External ear normal.  Left Ear: External ear normal.  Nose: Nose normal.  Eyes: Pupils are equal, round, and reactive to light. Conjunctivae and EOM are normal.  Neck: Normal range of motion. Neck supple.  Cardiovascular: Normal rate, regular rhythm, normal heart sounds and intact distal pulses.  Pulmonary/Chest: Effort normal and breath sounds normal.  Abdominal: Soft. Bowel sounds are normal.  Genitourinary: Cervix exhibits no motion tenderness and no discharge. There is bleeding in the vagina.  Genitourinary Comments: Large amount of blood clots in vagina removed with [place] Qtips.  Musculoskeletal: Normal range of motion.  Neurological: She is alert and oriented to person, place, and time.  Skin: Skin is warm. Capillary refill takes less than 2 seconds.  Psychiatric: She has a normal mood and affect. Her behavior is normal. Judgment and thought content normal.  Nursing note and vitals reviewed.    ED Treatments / Results  Labs (all labs ordered are listed, but only abnormal results are displayed) Labs Reviewed  BASIC METABOLIC PANEL - Abnormal; Notable for the following components:      Result Value   Sodium 134 (*)      Calcium 8.0 (*)    All other components within normal limits  CBC WITH DIFFERENTIAL/PLATELET - Abnormal; Notable for the following components:   Hemoglobin 8.2 (*)    HCT 30.0 (*)    MCV 61.2 (*)    MCH 16.7 (*)    MCHC 27.3 (*)    RDW 20.8 (*)    All other components within normal limits  URINALYSIS, ROUTINE W REFLEX MICROSCOPIC - Abnormal; Notable for the following components:   Color, Urine AMBER (*)    APPearance CLOUDY (*)    Hgb urine dipstick LARGE (*)    Ketones, ur 5 (*)    Protein, ur 100 (*)    RBC / HPF >50 (*)    All other components within normal limits  WET PREP, GENITAL  I-STAT BETA HCG BLOOD, ED ([facility], [facility], [facility] ONLY)  GC/CHLAMYDIA PROBE AMP ([place]) NOT AT [facility]    EKG None  Radiology No results found.  Procedures Procedures (including critical care time)  Medications Ordered in ED Medications  sodium chloride 0.9 % bolus 1,000 mL (0 mLs Intravenous Stopped 11/08/17 2258)  ketorolac (TORADOL) 30 MG/ML injection 30 mg (30 mg Intravenous Given 11/08/17 2058)     Initial Impression / Assessment and Plan / ED Course  I have reviewed the triage vital signs and the nursing notes.  Pertinent labs & imaging results that were available during my care of the patient were reviewed by me and considered in my medical decision making (see chart for details).    Pt's last hgb 9.1 in July, and she's been down to 7.1, 2 years ago.  She will be started on provera and iron.  She is to f/u with gyn.  Return if worse.  Final Clinical Impressions(s) / ED Diagnoses   Final diagnoses:  Dysfunctional uterine bleeding  Iron deficiency anemia due to chronic blood loss    ED Discharge Orders         Ordered    medroxyPROGESTERone (PROVERA) 10 MG tablet  Daily     11/08/17 2240    ferrous sulfate 325 (65 FE) MG tablet  Daily     11/08/17 2240           Jacalyn Lefevre, MD 11/08/17 2302

## 2017-11-12 LAB — GC/CHLAMYDIA PROBE AMP (~~LOC~~) NOT AT ARMC
Chlamydia: NEGATIVE
Neisseria Gonorrhea: POSITIVE — AB

## 2017-11-13 ENCOUNTER — Other Ambulatory Visit: Payer: Self-pay

## 2017-11-13 ENCOUNTER — Emergency Department (HOSPITAL_COMMUNITY)
Admission: EM | Admit: 2017-11-13 | Discharge: 2017-11-13 | Disposition: A | Discharge status: Home / Self Care | Payer: Medicaid Other | Attending: Emergency Medicine | Admitting: Emergency Medicine

## 2017-11-13 ENCOUNTER — Encounter (HOSPITAL_COMMUNITY): Payer: Self-pay | Admitting: Emergency Medicine

## 2017-11-13 DIAGNOSIS — Z202 Contact with and (suspected) exposure to infections with a predominantly sexual mode of transmission: Secondary | ICD-10-CM

## 2017-11-13 DIAGNOSIS — A549 Gonococcal infection, unspecified: Secondary | ICD-10-CM | POA: Insufficient documentation

## 2017-11-13 DIAGNOSIS — J45909 Unspecified asthma, uncomplicated: Secondary | ICD-10-CM | POA: Insufficient documentation

## 2017-11-13 MED ORDER — CEFTRIAXONE SODIUM 250 MG IJ SOLR
250.0000 mg | Freq: Once | INTRAMUSCULAR | Status: AC
  Administered 2017-11-13: 250 mg via INTRAMUSCULAR
  Filled 2017-11-13: qty 250

## 2017-11-13 MED ORDER — LIDOCAINE HCL (PF) 1 % IJ SOLN
INTRAMUSCULAR | Status: AC
  Filled 2017-11-13: qty 5

## 2017-11-13 MED ORDER — AZITHROMYCIN 250 MG PO TABS
1000.0000 mg | ORAL_TABLET | Freq: Once | ORAL | Status: AC
  Administered 2017-11-13: 1000 mg via ORAL
  Filled 2017-11-13: qty 4

## 2017-11-13 NOTE — ED Provider Notes (Signed)
[facility] EMERGENCY DEPARTMENT Provider Note   CSN: [id number] Arrival date & time: 11/13/17  1254     History   Chief Complaint Chief Complaint  Patient presents with  . Exposure to STD    HPI Kathryn Harris is a 27 y.o. female.  The history is provided by the patient. No language interpreter was used.  Exposure to STD  This is a new problem. The problem occurs constantly. Nothing aggravates the symptoms. Nothing relieves the symptoms. She has tried nothing for the symptoms.  Pt here for treatment of gonorrhea   Past Medical History:  Diagnosis Date  . Asthma     There are no active problems to display for this patient.   History reviewed. No pertinent surgical history.   OB History   None      Home Medications    Prior to Admission medications   Medication Sig Start Date End Date Taking? Authorizing Provider  albuterol (PROVENTIL HFA;VENTOLIN HFA) 108 (90 Base) MCG/ACT inhaler Inhale 1-2 puffs into the lungs every 6 (six) hours as needed for wheezing or shortness of breath.    [provider]  dexamethasone (DECADRON) 4 MG tablet Take 1 tablet (4 mg total) by mouth 2 (two) times daily with a meal. Patient not taking: Reported on 07/28/2017 07/03/17   Ivery QualeBryant, [place], PA-C  doxycycline (VIBRAMYCIN) 100 MG capsule Take 1 capsule (100 mg total) by mouth 2 (two) times daily. Patient not taking: Reported on 07/28/2017 10/09/15   Dione BoozeGlick, David, MD  ferrous sulfate 325 (65 FE) MG tablet Take 1 tablet (325 mg total) by mouth daily. 11/08/17   Jacalyn LefevreHaviland, Julie, MD  medroxyPROGESTERone (PROVERA) 10 MG tablet Take 1 tablet (10 mg total) by mouth daily for 5 days. 11/08/17 11/13/17  Jacalyn LefevreHaviland, Julie, MD    Family History No family history on file.  Social History Social History   Tobacco Use  . Smoking status: Never Smoker  . Smokeless tobacco: Former Engineer, waterUser  Substance Use Topics  . Alcohol use: Not Currently    Comment: occasionally  . Drug use: Not  Currently    Types: Marijuana     Allergies   Patient has no known allergies.   Review of Systems Review of Systems  All other systems reviewed and are negative.    Physical Exam Updated Vital Signs BP 121/73 (BP Location: Right Arm)   Pulse 71   Temp 98.4 F (36.9 C) (Oral)   Resp 17   Ht 5\' 5"  (1.651 m)   Wt 120 kg   LMP 11/08/2017   SpO2 100%   BMI 44.02 kg/m   Physical Exam  Constitutional: She appears well-developed and well-nourished.  HENT:  Head: Normocephalic.  Right Ear: External ear normal.  Left Ear: External ear normal.  Eyes: Pupils are equal, round, and reactive to light.  Neck: Normal range of motion.  Cardiovascular: Normal rate and regular rhythm.  Pulmonary/Chest: Effort normal.  Abdominal: Soft.  Musculoskeletal: Normal range of motion.  Neurological: She is alert.  Skin: Skin is warm.  Psychiatric: She has a normal mood and affect.  Nursing note and vitals reviewed.    ED Treatments / Results  Labs (all labs ordered are listed, but only abnormal results are displayed) Labs Reviewed - No data to display  EKG None  Radiology No results found.  Procedures Procedures (including critical care time)  Medications Ordered in ED Medications  cefTRIAXone (ROCEPHIN) injection 250 mg (has no administration in time range)  azithromycin (ZITHROMAX)  tablet 1,000 mg (has no administration in time range)     Initial Impression / Assessment and Plan / ED Course  I have reviewed the triage vital signs and the nursing notes.  Pertinent labs & imaging results that were available during my care of the patient were reviewed by me and considered in my medical decision making (see chart for details).       Final Clinical Impressions(s) / ED Diagnoses   Final diagnoses:  STD exposure  Gonorrhea    ED Discharge Orders    None    An After Visit Summary was printed and given to the patient.    Elson Areas, PA-C 11/13/17 1543      Bethann Berkshire, MD 11/14/17 [phone]

## 2017-11-13 NOTE — ED Notes (Addendum)
Pt states no one called pt concerning her positive results for gonorrhea.  Pt seen on her My Chart that results were positive and she came right away.   Pt states she has continued vaginal bleeding.

## 2017-11-13 NOTE — ED Triage Notes (Signed)
Patient states she was seen here recently and looked on My Chart and saw that she was positive for gonorrhea.

## 2018-03-28 ENCOUNTER — Emergency Department (HOSPITAL_COMMUNITY)
Admission: EM | Admit: 2018-03-28 | Discharge: 2018-03-28 | Disposition: A | Discharge status: Home / Self Care | Payer: Medicaid Other | Attending: Emergency Medicine | Admitting: Emergency Medicine

## 2018-03-28 ENCOUNTER — Encounter (HOSPITAL_COMMUNITY): Payer: Self-pay | Admitting: Emergency Medicine

## 2018-03-28 ENCOUNTER — Other Ambulatory Visit: Payer: Self-pay

## 2018-03-28 DIAGNOSIS — J4521 Mild intermittent asthma with (acute) exacerbation: Secondary | ICD-10-CM | POA: Insufficient documentation

## 2018-03-28 DIAGNOSIS — Z79899 Other long term (current) drug therapy: Secondary | ICD-10-CM | POA: Insufficient documentation

## 2018-03-28 DIAGNOSIS — J45909 Unspecified asthma, uncomplicated: Secondary | ICD-10-CM | POA: Insufficient documentation

## 2018-03-28 MED ORDER — IPRATROPIUM BROMIDE 0.02 % IN SOLN
0.5000 mg | Freq: Once | RESPIRATORY_TRACT | Status: AC
  Administered 2018-03-28: 0.5 mg via RESPIRATORY_TRACT
  Filled 2018-03-28: qty 2.5

## 2018-03-28 MED ORDER — ALBUTEROL SULFATE (2.5 MG/3ML) 0.083% IN NEBU
5.0000 mg | INHALATION_SOLUTION | Freq: Once | RESPIRATORY_TRACT | Status: AC
  Administered 2018-03-28: 5 mg via RESPIRATORY_TRACT
  Filled 2018-03-28: qty 6

## 2018-03-28 MED ORDER — ALBUTEROL SULFATE HFA 108 (90 BASE) MCG/ACT IN AERS
2.0000 | INHALATION_SPRAY | Freq: Four times a day (QID) | RESPIRATORY_TRACT | Status: DC | PRN
  Administered 2018-03-28: 2 via RESPIRATORY_TRACT
  Filled 2018-03-28: qty 6.7

## 2018-03-28 MED ORDER — AEROCHAMBER Z-STAT PLUS/MEDIUM MISC
1.0000 | Freq: Once | Status: AC
  Administered 2018-03-28: 1
  Filled 2018-03-28: qty 1

## 2018-03-28 NOTE — ED Triage Notes (Addendum)
Pt states "I was wheezing earlier at work" Pt states her shortness of breath around 2200 last night while working at [employer] in cold conditions. Pt states the Shortness of breath got worse when she got home after work last night. Pt denies pain. Pt ambulated to room from waiting room with steady gait and denies dizziness.

## 2018-03-28 NOTE — ED Provider Notes (Signed)
[facility] Provider Note   CSN: [id number] Arrival date & time: 03/28/18  0219  Time seen 2:45 AM  History   Chief Complaint Chief Complaint  Patient presents with  . Shortness of Breath    HPI Kathryn Harris is a 28 y.o. female.     HPI patient relates she had reactive airway disease when she was younger but is been a while since she had a flareup.  She states she is never had to be admitted or has never had to be on prednisone before.  She states a couple days ago she had some shortness of breath but it went away.  Tonight about 915 she started getting short of breath and having dyspnea on exertion at work and having wheezing.  She states she got home from work about 12:30 PM and it got worse.  She has a dry cough but she denies sore throat, rhinorrhea, or fever.  She states she does not have an inhaler anymore because it is been such a long time since she had a flareup.  PCP Gareth MorganKnowlton, Steve, MD  Past Medical History:  Diagnosis Date  . Asthma     There are no active problems to display for this patient.   History reviewed. No pertinent surgical history.   OB History   No obstetric history on file.      Home Medications    Prior to Admission medications   Medication Sig Start Date End Date Taking? Authorizing Provider  albuterol (PROVENTIL HFA;VENTOLIN HFA) 108 (90 Base) MCG/ACT inhaler Inhale 1-2 puffs into the lungs every 6 (six) hours as needed for wheezing or shortness of breath.    [provider]  dexamethasone (DECADRON) 4 MG tablet Take 1 tablet (4 mg total) by mouth 2 (two) times daily with a meal. Patient not taking: Reported on 07/28/2017 07/03/17   Ivery QualeBryant, Hobson, PA-C  doxycycline (VIBRAMYCIN) 100 MG capsule Take 1 capsule (100 mg total) by mouth 2 (two) times daily. Patient not taking: Reported on 07/28/2017 10/09/15   Dione BoozeGlick, David, MD  ferrous sulfate 325 (65 FE) MG tablet Take 1 tablet (325 mg total) by mouth daily.  11/08/17   Jacalyn LefevreHaviland, Julie, MD  medroxyPROGESTERone (PROVERA) 10 MG tablet Take 1 tablet (10 mg total) by mouth daily for 5 days. 11/08/17 11/13/17  Jacalyn LefevreHaviland, Julie, MD    Family History History reviewed. No pertinent family history.  Social History Social History   Tobacco Use  . Smoking status: Never Smoker  . Smokeless tobacco: Former Engineer, waterUser  Substance Use Topics  . Alcohol use: Not Currently    Comment: occasionally  . Drug use: Not Currently    Types: Marijuana  employed   Allergies   Patient has no known allergies.   Review of Systems Review of Systems  All other systems reviewed and are negative.    Physical Exam Updated Vital Signs BP 109/68 (BP Location: Left Arm)   Pulse 69   Temp 97.9 F (36.6 C) (Axillary)   Resp (!) 25   Ht 5\' 5"  (1.651 m)   Wt 127 kg   SpO2 100%   BMI 46.59 kg/m   Vital signs normal    Physical Exam Vitals signs and nursing note reviewed.  Constitutional:      General: She is not in acute distress.    Appearance: Normal appearance. She is well-developed. She is not ill-appearing or toxic-appearing.  HENT:     Head: Normocephalic and atraumatic.  Right Ear: External ear normal.     Left Ear: External ear normal.     Nose: Nose normal. No mucosal edema or rhinorrhea.     Mouth/Throat:     Mouth: Mucous membranes are moist.     Dentition: No dental abscesses.     Pharynx: No oropharyngeal exudate, posterior oropharyngeal erythema or uvula swelling.  Eyes:     Extraocular Movements: Extraocular movements intact.     Conjunctiva/sclera: Conjunctivae normal.     Pupils: Pupils are equal, round, and reactive to light.  Neck:     Musculoskeletal: Full passive range of motion without pain, normal range of motion and neck supple.  Cardiovascular:     Rate and Rhythm: Normal rate and regular rhythm.     Heart sounds: Normal heart sounds. No murmur. No friction rub. No gallop.   Pulmonary:     Effort: Pulmonary effort is normal.  Tachypnea present. No respiratory distress.     Breath sounds: Wheezing present. No rhonchi or rales.     Comments: Diffuse wheezes Chest:     Chest wall: No tenderness or crepitus.  Abdominal:     General: Bowel sounds are normal. There is no distension.     Palpations: Abdomen is soft.     Tenderness: There is no abdominal tenderness. There is no guarding or rebound.  Musculoskeletal: Normal range of motion.        General: No tenderness.     Comments: Moves all extremities well.   Skin:    General: Skin is warm and dry.     Coloration: Skin is not pale.     Findings: No erythema or rash.  Neurological:     Mental Status: She is alert and oriented to person, place, and time.     Cranial Nerves: No cranial nerve deficit.  Psychiatric:        Mood and Affect: Mood is not anxious.        Speech: Speech normal.        Behavior: Behavior normal.      ED Treatments / Results  Labs (all labs ordered are listed, but only abnormal results are displayed) Labs Reviewed - No data to display  EKG None  Radiology No results found.  Procedures Procedures (including critical care time)  Medications Ordered in ED Medications  albuterol (PROVENTIL HFA;VENTOLIN HFA) 108 (90 Base) MCG/ACT inhaler 2 puff (2 puffs Inhalation Given 03/28/18 0341)  aerochamber Z-Stat Plus/medium 1 each (1 each Other Not Given 03/28/18 0406)  albuterol (PROVENTIL) (2.5 MG/3ML) 0.083% nebulizer solution 5 mg (5 mg Nebulization Given 03/28/18 0257)  ipratropium (ATROVENT) nebulizer solution 0.5 mg (0.5 mg Nebulization Given 03/28/18 0257)  albuterol (PROVENTIL) (2.5 MG/3ML) 0.083% nebulizer solution 5 mg (5 mg Nebulization Given 03/28/18 0341)  ipratropium (ATROVENT) nebulizer solution 0.5 mg (0.5 mg Nebulization Given 03/28/18 0341)     Initial Impression / Assessment and Plan / ED Course  I have reviewed the triage vital signs and the nursing notes.  Pertinent labs & imaging results that were available  during my care of the patient were reviewed by me and considered in my medical decision making (see chart for details).    Patient was given an albuterol and Atrovent nebulizer treatment.   Recheck at 3:35 AM patient is feeling better.  She has improved air movement however she now just has some late end expiratory rhonchi.  She was given a second nebulizer treatment.  She also was given an albuterol inhaler with  a spacer.  She states she still having a dry cough.  Patient is a non-smoker, at this point I do not feel like antibiotics are indicated.  We are in the midst of pollen season which may be causing her flareup of her reactive airway disease.  Recheck at time of discharge shows again more improved air movement however she now has a few late end expiratory wheezing however she states she is tired wants to go home.  She had been given an albuterol inhaler and spacer to use and she states she will do that when she gets home.  Final Clinical Impressions(s) / ED Diagnoses   Final diagnoses:  Mild intermittent asthma with exacerbation    ED Discharge Orders    None     Plan discharge  Devoria Albe, MD, Concha Pyo, MD 03/28/18 [phone]

## 2018-03-28 NOTE — Discharge Instructions (Signed)
Use the inhaler with the spacer, 2 puffs every 4-6 hours as needed for wheezing or shortness of breath.  Recheck if you get a fever, you start coughing up mucus or you struggle to breathe again.

## 2018-06-17 ENCOUNTER — Other Ambulatory Visit: Payer: Medicaid Other

## 2018-06-17 ENCOUNTER — Other Ambulatory Visit: Payer: Self-pay

## 2018-06-17 DIAGNOSIS — Z20822 Contact with and (suspected) exposure to covid-19: Secondary | ICD-10-CM

## 2018-06-20 ENCOUNTER — Emergency Department (HOSPITAL_COMMUNITY): Payer: Self-pay

## 2018-06-20 ENCOUNTER — Encounter (HOSPITAL_COMMUNITY): Payer: Self-pay | Admitting: Emergency Medicine

## 2018-06-20 ENCOUNTER — Emergency Department (HOSPITAL_COMMUNITY)
Admission: EM | Admit: 2018-06-20 | Discharge: 2018-06-20 | Disposition: A | Discharge status: Home / Self Care | Payer: Self-pay | Attending: Emergency Medicine | Admitting: Emergency Medicine

## 2018-06-20 ENCOUNTER — Other Ambulatory Visit: Payer: Self-pay

## 2018-06-20 DIAGNOSIS — Z87891 Personal history of nicotine dependence: Secondary | ICD-10-CM | POA: Insufficient documentation

## 2018-06-20 DIAGNOSIS — R05 Cough: Secondary | ICD-10-CM | POA: Insufficient documentation

## 2018-06-20 DIAGNOSIS — J45909 Unspecified asthma, uncomplicated: Secondary | ICD-10-CM | POA: Insufficient documentation

## 2018-06-20 DIAGNOSIS — R072 Precordial pain: Secondary | ICD-10-CM | POA: Insufficient documentation

## 2018-06-20 DIAGNOSIS — R0602 Shortness of breath: Secondary | ICD-10-CM | POA: Insufficient documentation

## 2018-06-20 LAB — CBC
HCT: 40.8 % (ref 36.0–46.0)
Hemoglobin: 12.1 g/dL (ref 12.0–15.0)
MCH: 21.3 pg — ABNORMAL LOW (ref 26.0–34.0)
MCHC: 29.7 g/dL — ABNORMAL LOW (ref 30.0–36.0)
MCV: 72 fL — ABNORMAL LOW (ref 80.0–100.0)
Platelets: 308 10*3/uL (ref 150–400)
RBC: 5.67 MIL/uL — ABNORMAL HIGH (ref 3.87–5.11)
RDW: 21.4 % — ABNORMAL HIGH (ref 11.5–15.5)
WBC: 7 10*3/uL (ref 4.0–10.5)
nRBC: 0 % (ref 0.0–0.2)

## 2018-06-20 LAB — BASIC METABOLIC PANEL
Anion gap: 10 (ref 5–15)
BUN: 11 mg/dL (ref 6–20)
CO2: 24 mmol/L (ref 22–32)
Calcium: 8.9 mg/dL (ref 8.9–10.3)
Chloride: 103 mmol/L (ref 98–111)
Creatinine, Ser: 0.74 mg/dL (ref 0.44–1.00)
GFR calc Af Amer: >60 mL/min (ref >60)
GFR calc non Af Amer: >60 mL/min (ref >60)
Glucose, Bld: 93 mg/dL (ref 70–99)
Potassium: 4.4 mmol/L (ref 3.5–5.1)
Sodium: 137 mmol/L (ref 135–145)

## 2018-06-20 LAB — D-DIMER, QUANTITATIVE (NOT AT ARMC): D-Dimer, Quant: <0.27 ug/mL-FEU (ref 0.00–0.50)

## 2018-06-20 LAB — NOVEL CORONAVIRUS, NAA: SARS-CoV-2, NAA: NOT DETECTED

## 2018-06-20 LAB — TROPONIN I: Troponin I: <0.03 ng/mL (ref <0.03)

## 2018-06-20 MED ORDER — SODIUM CHLORIDE 0.9% FLUSH
3.0000 mL | Freq: Once | INTRAVENOUS | Status: AC
  Administered 2018-06-20: 3 mL via INTRAVENOUS

## 2018-06-20 MED ORDER — ALBUTEROL SULFATE HFA 108 (90 BASE) MCG/ACT IN AERS: 1.0000 | INHALATION_SPRAY | Freq: Four times a day (QID) | RESPIRATORY_TRACT | 0 refills | Status: DC | PRN

## 2018-06-20 MED ORDER — PREDNISONE 20 MG PO TABS: 40.0000 mg | ORAL_TABLET | Freq: Every day | ORAL | 0 refills | Status: AC

## 2018-06-20 NOTE — Discharge Instructions (Signed)

## 2018-06-20 NOTE — ED Provider Notes (Signed)
Emergency Department Provider Note   I have reviewed the triage vital signs and the nursing notes.   HISTORY  Chief Complaint Chest Pain   HPI Kathryn Harris is a 28 y.o. female with PMH of asthma presents to the emergency department for evaluation of chest pain with intermittent shortness of breath.  She has been treating this since March with intermittent improvement.  She is tried albuterol inhaler with some wheezing.  Denies fever.  She did have an outpatient COVID test which was negative.  No history of PE/DVT.  Patient states that symptoms worsened 5 days prior.  Mild cough at that time.  Pain today is somewhat pleuritic and increased with inspiration. No exertional. Pain is just left of center in the anterior chest.    Past Medical History:  Diagnosis Date  . Asthma     There are no active problems to display for this patient.   History reviewed. No pertinent surgical history.  Allergies Patient has no known allergies.  Family History  Problem Relation Age of Onset  . Hypertension Father   . Diabetes Other     Social History Social History   Tobacco Use  . Smoking status: Never Smoker  . Smokeless tobacco: Former Network engineer Use Topics  . Alcohol use: Not Currently    Comment: occasionally  . Drug use: Not Currently    Types: Marijuana    Review of Systems  Constitutional: No fever/chills Eyes: No visual changes. ENT: No sore throat. Cardiovascular: Positive chest pain. Respiratory: Positive shortness of breath. Gastrointestinal: No abdominal pain.  No nausea, no vomiting.  No diarrhea.  No constipation. Genitourinary: Negative for dysuria. Musculoskeletal: Negative for back pain. Skin: Negative for rash. Neurological: Negative for headaches, focal weakness or numbness.  10-point ROS otherwise negative.  ____________________________________________   PHYSICAL EXAM:  VITAL SIGNS: ED Triage Vitals  Enc Vitals Group     BP  06/20/18 1138 121/81     Pulse Rate 06/20/18 1138 77     Resp 06/20/18 1138 16     Temp 06/20/18 1138 98.1 F (36.7 C)     Temp Source 06/20/18 1138 Oral     SpO2 06/20/18 1138 99 %     Weight 06/20/18 1139 275 lb (124.7 kg)     Height 06/20/18 1139 5\' 5"  (1.651 m)   Constitutional: Alert and oriented. Well appearing and in no acute distress. Eyes: Conjunctivae are normal.  Head: Atraumatic. Nose: No congestion/rhinnorhea. Mouth/Throat: Mucous membranes are moist.  Neck: No stridor.  Cardiovascular: Normal rate, regular rhythm. Good peripheral circulation. Grossly normal heart sounds.   Respiratory: Normal respiratory effort.  No retractions. Lungs with scattered bilateral end-expiratory wheezing.  Gastrointestinal: Soft and nontender. No distention.  Musculoskeletal: No lower extremity tenderness nor edema. No gross deformities of extremities. Neurologic:  Normal speech and language. No gross focal neurologic deficits are appreciated.  Skin:  Skin is warm, dry and intact. No rash noted.  ____________________________________________   LABS (all labs ordered are listed, but only abnormal results are displayed)  Labs Reviewed  CBC - Abnormal; Notable for the following components:      Result Value   RBC 5.67 (*)    MCV 72.0 (*)    MCH 21.3 (*)    MCHC 29.7 (*)    RDW 21.4 (*)    All other components within normal limits  BASIC METABOLIC PANEL  TROPONIN I  D-DIMER, QUANTITATIVE (NOT AT [facility])  POC URINE PREG, ED  ____________________________________________  EKG   EKG Interpretation  Date/Time:  Thursday June 20 2018 11:33:05 EDT Ventricular Rate:  84 PR Interval:  148 QRS Duration: 72 QT Interval:  354 QTC Calculation: 418 R Axis:   59 Text Interpretation:  Normal sinus rhythm Normal ECG No STEMI  Confirmed by Alona BeneLong, Joshua [id number]) on 06/20/2018 11:39:20 AM       ____________________________________________  RADIOLOGY  Dg Chest 2 View  Result Date:  06/20/2018 CLINICAL DATA:  Shortness of breath.  Left chest pain. EXAM: CHEST - 2 VIEW COMPARISON:  July 15, 2013 FINDINGS: The heart size and mediastinal contours are within normal limits. Both lungs are clear. The visualized skeletal structures are unremarkable. IMPRESSION: No active cardiopulmonary disease. Electronically Signed   By: Gerome Samavid  Williams III M.D   On: 06/20/2018 12:43    ____________________________________________   PROCEDURES  Procedure(s) performed:   Procedures  None  ____________________________________________   INITIAL IMPRESSION / ASSESSMENT AND PLAN / ED COURSE  Pertinent labs & imaging results that were available during my care of the patient were reviewed by me and considered in my medical decision making (see chart for details).   Patient presents to the emergency department with chest pain along with shortness of breath.  Symptoms worse over the past 5 days.  Somewhat pleuritic quality to her pain description.  Plan for labs including chest x-ray, d-dimer, and troponin.   Plain films and labs unremarkable. Plan for steroid burst and albuterol at home with return to the ED if symptoms worsen. Discussed need for PCP follow up as well in the coming week.  ____________________________________________  FINAL CLINICAL IMPRESSION(S) / ED DIAGNOSES  Final diagnoses:  Precordial chest pain  SOB (shortness of breath)     MEDICATIONS GIVEN DURING THIS VISIT:  Medications  sodium chloride flush (NS) 0.9 % injection 3 mL (3 mLs Intravenous Given 06/20/18 1210)     NEW OUTPATIENT MEDICATIONS STARTED DURING THIS VISIT:  Discharge Medication List as of 06/20/2018  1:01 PM    START taking these medications   Details  predniSONE (DELTASONE) 20 MG tablet Take 2 tablets (40 mg total) by mouth daily for 5 days., Starting Thu 06/20/2018, Until Tue 06/25/2018, Print        Note:  This document was prepared using Dragon voice recognition software and may include  unintentional dictation errors.  Alona BeneJoshua Long, MD Emergency Medicine    Long, Arlyss RepressJoshua G, MD 06/20/18 1355

## 2018-06-20 NOTE — ED Triage Notes (Signed)
Patient reports intermittent chest pain with SOB when she walks. Patient states her symptoms first started in March. Patient reports fever on Sunday night of 101. No fever today, or since Sunday night. Patient states she has had a COVID test which was negative. Ambulatory to Triage without difficulty.

## 2018-07-16 ENCOUNTER — Other Ambulatory Visit: Payer: Medicaid Other

## 2018-07-16 ENCOUNTER — Other Ambulatory Visit: Payer: Self-pay

## 2018-07-16 DIAGNOSIS — Z20822 Contact with and (suspected) exposure to covid-19: Secondary | ICD-10-CM

## 2018-07-19 ENCOUNTER — Emergency Department (HOSPITAL_COMMUNITY)
Admission: EM | Admit: 2018-07-19 | Discharge: 2018-07-20 | Disposition: A | Discharge status: Home / Self Care | Payer: HRSA Program | Attending: Emergency Medicine | Admitting: Emergency Medicine

## 2018-07-19 ENCOUNTER — Other Ambulatory Visit: Payer: Self-pay

## 2018-07-19 ENCOUNTER — Encounter (HOSPITAL_COMMUNITY): Payer: Self-pay | Admitting: Emergency Medicine

## 2018-07-19 DIAGNOSIS — Z79899 Other long term (current) drug therapy: Secondary | ICD-10-CM | POA: Diagnosis not present

## 2018-07-19 DIAGNOSIS — Z87891 Personal history of nicotine dependence: Secondary | ICD-10-CM | POA: Diagnosis not present

## 2018-07-19 DIAGNOSIS — Z20822 Contact with and (suspected) exposure to covid-19: Secondary | ICD-10-CM

## 2018-07-19 DIAGNOSIS — U071 COVID-19: Secondary | ICD-10-CM | POA: Insufficient documentation

## 2018-07-19 DIAGNOSIS — J45909 Unspecified asthma, uncomplicated: Secondary | ICD-10-CM | POA: Diagnosis not present

## 2018-07-19 DIAGNOSIS — R52 Pain, unspecified: Secondary | ICD-10-CM | POA: Diagnosis present

## 2018-07-19 DIAGNOSIS — Z20828 Contact with and (suspected) exposure to other viral communicable diseases: Secondary | ICD-10-CM

## 2018-07-19 NOTE — ED Provider Notes (Signed)
[facility] EMERGENCY DEPARTMENT Provider Note   CSN: [id number] Arrival date & time: 07/19/18  2216     History   Chief Complaint Chief Complaint  Patient presents with  . Emesis    HPI Kathryn Harris is a 28 y.o. female.     HPI  This is a 28 year old female who presents with body aches, nausea, vomiting, diarrhea, and loss of taste.  Patient reports symptoms started last Thursday approximately 8 days ago.  She lost her sense of taste.  She subsequently had myalgias through the weekend and had some nausea, vomiting, and diarrhea earlier this week.  Those symptoms have since resolved.  She states that she has had chills and generally "not feeling good today."  She denies any cough.  At baseline she reports shortness of breath that has been ongoing for many months.  There has been no change in this.  She denies any chest pain.  She reports chills without documented fevers.  She denies any close sick contacts but reports that she saw someone in [place] who later told her that she had tested positive.  She reports wearing a mask at home.  Patient reports that she is here for COVID testing.  Past Medical History:  Diagnosis Date  . Asthma     There are no active problems to display for this patient.   History reviewed. No pertinent surgical history.   OB History    Gravida      Para      Term      Preterm      AB      Living  0     SAB      TAB      Ectopic      Multiple      Live Births               Home Medications    Prior to Admission medications   Medication Sig Start Date End Date Taking? Authorizing Provider  albuterol (VENTOLIN HFA) 108 (90 Base) MCG/ACT inhaler Inhale 1-2 puffs into the lungs every 6 (six) hours as needed for wheezing or shortness of breath. 06/20/18   Long, Wonda Olds, MD  ferrous sulfate 325 (65 FE) MG tablet Take 1 tablet (325 mg total) by mouth daily. 11/08/17   Isla Pence, MD  medroxyPROGESTERone (PROVERA) 10 MG  tablet Take 1 tablet (10 mg total) by mouth daily for 5 days. 11/08/17 11/13/17  Isla Pence, MD    Family History Family History  Problem Relation Age of Onset  . Hypertension Father   . Diabetes Other     Social History Social History   Tobacco Use  . Smoking status: Never Smoker  . Smokeless tobacco: Former Network engineer Use Topics  . Alcohol use: Not Currently    Comment: occasionally  . Drug use: Not Currently    Types: Marijuana     Allergies   Patient has no known allergies.   Review of Systems Review of Systems  Constitutional: Positive for chills. Negative for fever.  HENT:       Loss of taste  Respiratory: Positive for shortness of breath. Negative for cough.   Cardiovascular: Negative for chest pain.  Gastrointestinal: Positive for diarrhea, nausea and vomiting. Negative for abdominal pain.  Genitourinary: Negative for dysuria.  Musculoskeletal: Positive for myalgias.  All other systems reviewed and are negative.    Physical Exam Updated Vital Signs BP (!) 140/56 (BP Location: Right Arm)  Pulse 77   Temp 98.2 F (36.8 C) (Oral)   Resp 18   Ht 1.651 m (5\' 5" )   Wt 124.7 kg   LMP 07/02/2018   SpO2 96%   BMI 45.76 kg/m   Physical Exam Vitals signs and nursing note reviewed.  Constitutional:      Appearance: She is well-developed. She is obese.  HENT:     Head: Normocephalic and atraumatic.     Mouth/Throat:     Mouth: Mucous membranes are moist.  Eyes:     Pupils: Pupils are equal, round, and reactive to light.  Neck:     Musculoskeletal: Neck supple.  Cardiovascular:     Rate and Rhythm: Normal rate and regular rhythm.     Heart sounds: Normal heart sounds.  Pulmonary:     Effort: Pulmonary effort is normal. No respiratory distress.     Breath sounds: No wheezing.  Abdominal:     General: Bowel sounds are normal.     Palpations: Abdomen is soft.     Tenderness: There is no abdominal tenderness.  Musculoskeletal:     Right  lower leg: No edema.     Left lower leg: No edema.  Skin:    General: Skin is warm and dry.     Findings: No rash.  Neurological:     Mental Status: She is alert and oriented to person, place, and time.  Psychiatric:        Mood and Affect: Mood normal.      ED Treatments / Results  Labs (all labs ordered are listed, but only abnormal results are displayed) Labs Reviewed  NOVEL CORONAVIRUS, NAA (HOSPITAL ORDER, SEND-OUT TO REF LAB)    EKG None  Radiology No results found.  Procedures Procedures (including critical care time)  Medications Ordered in ED Medications - No data to display   Initial Impression / Assessment and Plan / ED Course  I have reviewed the triage vital signs and the nursing notes.  Pertinent labs & imaging results that were available during my care of the patient were reviewed by me and considered in my medical decision making (see chart for details).        Patient presents with over 1 week symptoms of loss of taste, nausea, vomiting, diarrhea, chills.  Most of her symptoms have improved but she had persistent chills today.  She is overall nontoxic-appearing and vital signs are reassuring.  She is afebrile.  Her O2 sats are 96% on room air and her pulmonary exam is normal.  Her physical exam is largely benign.  She reports that she is only here for COVID testing and otherwise would not have come.  Given a benign exam, I will not pursue imaging or other testing at this time.  I did order outpatient COVID testing.  I have recommended that she self isolate until results.  After history, exam, and medical workup I feel the patient has been appropriately medically screened and is safe for discharge home. Pertinent diagnoses were discussed with the patient. Patient was given return precautions.  Kathryn Harris was evaluated in Emergency Department on 07/19/2018 for the symptoms described in the history of present illness. She was evaluated in the  context of the global COVID-19 pandemic, which necessitated consideration that the patient might be at risk for infection with the SARS-CoV-2 virus that causes COVID-19. Institutional protocols and algorithms that pertain to the evaluation of patients at risk for COVID-19 are in a state of rapid change  based on information released by regulatory bodies including the CDC and federal and state organizations. These policies and algorithms were followed during the patient's care in the ED.  Final Clinical Impressions(s) / ED Diagnoses   Final diagnoses:  Exposure to Covid-19 Virus    ED Discharge Orders    None       Horton, Mayer Maskerourtney F, MD 07/19/18 2355

## 2018-07-19 NOTE — ED Triage Notes (Signed)
C/o lost sense of taste last Thursday, body aches on Friday, Tuesday n/v/d all day. Today C/o fever, chills and "not feeling good today."

## 2018-07-19 NOTE — Discharge Instructions (Addendum)
You were seen today with symptoms suspicious for COVID-19.  Testing is pending.  You need to self isolate until test return.  If the test is positive, you should quarantine for 14 days.  If you have worsening shortness of breath or any new or worsening symptoms you should be reevaluated.

## 2018-07-19 NOTE — ED Notes (Signed)
ED Provider at bedside. 

## 2018-07-21 LAB — NOVEL CORONAVIRUS, NAA: SARS-CoV-2, NAA: DETECTED — AB

## 2018-07-23 LAB — NOVEL CORONAVIRUS, NAA (HOSP ORDER, SEND-OUT TO REF LAB; TAT 18-24 HRS): SARS-CoV-2, NAA: DETECTED — AB

## 2018-07-31 ENCOUNTER — Other Ambulatory Visit: Payer: Self-pay

## 2018-07-31 DIAGNOSIS — Z20822 Contact with and (suspected) exposure to covid-19: Secondary | ICD-10-CM

## 2018-08-02 LAB — NOVEL CORONAVIRUS, NAA: SARS-CoV-2, NAA: NOT DETECTED

## 2018-10-07 ENCOUNTER — Other Ambulatory Visit: Payer: Self-pay

## 2018-10-07 ENCOUNTER — Encounter (HOSPITAL_COMMUNITY): Payer: Self-pay

## 2018-10-07 ENCOUNTER — Emergency Department (HOSPITAL_COMMUNITY)
Admission: EM | Admit: 2018-10-07 | Discharge: 2018-10-07 | Disposition: A | Discharge status: Home / Self Care | Payer: Medicaid Other | Attending: Emergency Medicine | Admitting: Emergency Medicine

## 2018-10-07 ENCOUNTER — Emergency Department (HOSPITAL_COMMUNITY): Payer: Medicaid Other

## 2018-10-07 DIAGNOSIS — J45901 Unspecified asthma with (acute) exacerbation: Secondary | ICD-10-CM

## 2018-10-07 DIAGNOSIS — R05 Cough: Secondary | ICD-10-CM

## 2018-10-07 DIAGNOSIS — R059 Cough, unspecified: Secondary | ICD-10-CM

## 2018-10-07 MED ORDER — PREDNISONE 50 MG PO TABS
60.0000 mg | ORAL_TABLET | Freq: Once | ORAL | Status: AC
  Administered 2018-10-07: 60 mg via ORAL
  Filled 2018-10-07: qty 1

## 2018-10-07 MED ORDER — ALBUTEROL SULFATE HFA 108 (90 BASE) MCG/ACT IN AERS
6.0000 | INHALATION_SPRAY | Freq: Once | RESPIRATORY_TRACT | Status: AC
  Administered 2018-10-07: 22:00:00 6 via RESPIRATORY_TRACT
  Filled 2018-10-07: qty 6.7

## 2018-10-07 MED ORDER — PREDNISONE 10 MG (21) PO TBPK: ORAL_TABLET | ORAL | 0 refills | Status: DC

## 2018-10-07 MED ORDER — AEROCHAMBER PLUS FLO-VU MEDIUM MISC
1.0000 | Freq: Once | Status: AC
  Administered 2018-10-07: 1
  Filled 2018-10-07: qty 1

## 2018-10-07 NOTE — Discharge Instructions (Addendum)
You were seen in the emergency department today for trouble breathing, cough, and wheezing.  Your chest x-ray was normal.  We suspect you are having an asthma exacerbation.  Please use your inhaler 2 puffs every 4-6 hours for the next 48 hours then as needed for shortness of breath/wheezing.  We are sending home with a steroid taper, please take this as prescribed.  Take this in the morning with breakfast.  We have prescribed you new medication(s) today. Discuss the medications prescribed today with your pharmacist as they can have adverse effects and interactions with your other medicines including over the counter and prescribed medications. Seek medical evaluation if you start to experience new or abnormal symptoms after taking one of these medicines, seek care immediately if you start to experience difficulty breathing, feeling of your throat closing, facial swelling, or rash as these could be indications of a more serious allergic reaction  Please follow-up with your primary care provider within 3 days.  Return to the ER for new or worsening symptoms including but not limited to worsening trouble breathing, chest pain, fever, passing out, or any other concerns.

## 2018-10-07 NOTE — ED Provider Notes (Signed)
[facility] EMERGENCY DEPARTMENT Provider Note   CSN: [id number] Arrival date & time: 10/07/18  [place]     History   Chief Complaint Chief Complaint  Patient presents with  . Cough    HPI Kathryn Harris is a 28 y.o. female with a hx of asthma who presents to the ED w/ complaints of respiratory sxs x 1 week. Sxs include dry cough, dyspnea, & wheezing. Minimally alleviated w/ inhaler use but not resolved. Worse @ night, no other alleviating/aggravating factors. Denies URI sxs, fever, chills, chest pain, or vomiting. Denies leg pain/swelling, hemoptysis, recent surgery/trauma, recent long travel, hormone use, personal hx of cancer, or hx of DVT/PE.       HPI  Past Medical History:  Diagnosis Date  . Asthma     There are no active problems to display for this patient.   History reviewed. No pertinent surgical history.   OB History    Gravida      Para      Term      Preterm      AB      Living  0     SAB      TAB      Ectopic      Multiple      Live Births               Home Medications    Prior to Admission medications   Medication Sig Start Date End Date Taking? Authorizing Provider  albuterol (VENTOLIN HFA) 108 (90 Base) MCG/ACT inhaler Inhale 1-2 puffs into the lungs every 6 (six) hours as needed for wheezing or shortness of breath. 06/20/18  Yes Long, Wonda Olds, MD  Pseudoeph-Doxylamine-DM-APAP (NYQUIL MULTI-SYMPTOM PO) Take 10-15 mLs by mouth daily as needed (for cold/cough symptoms).   Yes [provider]    Family History Family History  Problem Relation Age of Onset  . Hypertension Father   . Diabetes Other     Social History Social History   Tobacco Use  . Smoking status: Never Smoker  . Smokeless tobacco: Former Network engineer Use Topics  . Alcohol use: Not Currently    Comment: occasionally  . Drug use: Not Currently    Types: Marijuana     Allergies   Patient has no known allergies.   Review of Systems  Review of Systems  Constitutional: Negative for chills and fever.  HENT: Negative for congestion, ear pain and sore throat.   Respiratory: Positive for cough, shortness of breath and wheezing.   Cardiovascular: Negative for chest pain and leg swelling.  Gastrointestinal: Negative for abdominal pain, diarrhea, nausea and vomiting.  Genitourinary: Negative for dysuria.  Neurological: Negative for syncope.  All other systems reviewed and are negative.    Physical Exam Updated Vital Signs BP 121/76 (BP Location: Right Arm)   Pulse 94   Temp 98.2 F (36.8 C) (Oral)   Resp 20   Ht 5\' 5"  (1.651 m)   Wt 131.5 kg   LMP 09/23/2018   SpO2 100%   BMI 48.26 kg/m   Physical Exam Vitals signs and nursing note reviewed.  Constitutional:      General: She is not in acute distress.    Appearance: She is well-developed.  HENT:     Head: Normocephalic and atraumatic.     Right Ear: Ear canal normal. Tympanic membrane is not perforated, erythematous, retracted or bulging.     Left Ear: Ear canal normal. Tympanic membrane is not  perforated, erythematous, retracted or bulging.     Ears:     Comments: No mastoid erythema/swelling/tenderness.     Nose:     Right Sinus: No maxillary sinus tenderness or frontal sinus tenderness.     Left Sinus: No maxillary sinus tenderness or frontal sinus tenderness.     Mouth/Throat:     Pharynx: Uvula midline. No oropharyngeal exudate or posterior oropharyngeal erythema.     Comments: Posterior oropharynx is symmetric appearing. Patient tolerating own secretions without difficulty. No trismus. No drooling. No hot potato voice. No swelling beneath the tongue, submandibular compartment is soft.  Eyes:     General:        Right eye: No discharge.        Left eye: No discharge.     Conjunctiva/sclera: Conjunctivae normal.     Pupils: Pupils are equal, round, and reactive to light.  Neck:     Musculoskeletal: Normal range of motion and neck supple. No edema or  neck rigidity.  Cardiovascular:     Rate and Rhythm: Normal rate and regular rhythm.     Heart sounds: No murmur.  Pulmonary:     Effort: Pulmonary effort is normal. No respiratory distress.     Breath sounds: Wheezing (biphasic wheeze throughout) present. No rhonchi or rales.  Abdominal:     General: There is no distension.     Palpations: Abdomen is soft.     Tenderness: There is no abdominal tenderness.  Musculoskeletal:     Right lower leg: No edema.     Left lower leg: No edema.  Lymphadenopathy:     Cervical: No cervical adenopathy.  Skin:    General: Skin is warm and dry.     Findings: No rash.  Neurological:     Mental Status: She is alert.  Psychiatric:        Behavior: Behavior normal.    ED Treatments / Results  Labs (all labs ordered are listed, but only abnormal results are displayed) Labs Reviewed - No data to display  EKG None  Radiology Dg Chest Port 1 View  Result Date: 10/07/2018 CLINICAL DATA:  Nonproductive cough EXAM: PORTABLE CHEST 1 VIEW COMPARISON:  He June 20, 2018 FINDINGS: The heart size and mediastinal contours are within normal limits. Both lungs are clear. The visualized skeletal structures are unremarkable. IMPRESSION: No acute cardiopulmonary process. Electronically Signed   By: Jonna Clark M.D.   On: 10/07/2018 21:34    Procedures Procedures (including critical care time)  Medications Ordered in ED Medications  albuterol (VENTOLIN HFA) 108 (90 Base) MCG/ACT inhaler 6 puff (6 puffs Inhalation Given 10/07/18 2220)  AeroChamber Plus Flo-Vu Medium MISC 1 each (1 each Other Given 10/07/18 2221)  predniSONE (DELTASONE) tablet 60 mg (60 mg Oral Given 10/07/18 2220)     Initial Impression / Assessment and Plan / ED Course  I have reviewed the triage vital signs and the nursing notes.  Pertinent labs & imaging results that were available during my care of the patient were reviewed by me and considered in my medical decision making (see chart  for details).   Patient w/ hx of asthma presents to the ED w/ complaints of dry cough, wheezing, & dyspnea x 1 week. Nontoxic appearing, resting comfortably, vitals WNL. Exam w/ biphasic wheezing throughout, no respiratory distress. CXR by triage negative for infiltrate, edema, or PTX. No infiltrate, afebrile- doubt pneumonia. PERC negative- doubt PE. Seems consistent w/ asthma exacerbation, some improvement in breath sounds w/ repeat  lung exam s/p albuterol inhaler w/ spacer, not hypoxic, appears appropriate for discharge w/ steroid taper & continued inhaler use. I discussed results, treatment plan, need for follow-up, and return precautions with the patient. Provided opportunity for questions, patient confirmed understanding and is in agreement with plan.    Final Clinical Impressions(s) / ED Diagnoses   Final diagnoses:  Exacerbation of asthma, unspecified asthma severity, unspecified whether persistent    ED Discharge Orders         Ordered    predniSONE (STERAPRED UNI-PAK 21 TAB) 10 MG (21) TBPK tablet     10/07/18 2239           Cherly Andersonetrucelli, Jisselle Poth R, PA-C 10/07/18 2248    Sabas SousBero, Michael M, MD 10/08/18 2246

## 2018-10-07 NOTE — ED Triage Notes (Signed)
Pt presents to ED with non productive cough and wheezing, sob x 1 week. Pt with hx asthma

## 2018-11-20 ENCOUNTER — Other Ambulatory Visit: Payer: Self-pay

## 2018-11-20 DIAGNOSIS — Z20822 Contact with and (suspected) exposure to covid-19: Secondary | ICD-10-CM

## 2018-11-22 LAB — NOVEL CORONAVIRUS, NAA: SARS-CoV-2, NAA: NOT DETECTED

## 2018-12-05 ENCOUNTER — Other Ambulatory Visit: Payer: Self-pay

## 2018-12-05 DIAGNOSIS — Z20822 Contact with and (suspected) exposure to covid-19: Secondary | ICD-10-CM

## 2018-12-07 LAB — NOVEL CORONAVIRUS, NAA: SARS-CoV-2, NAA: NOT DETECTED

## 2018-12-12 IMAGING — CT CT HEAD W/O CM
3 series · 16 of 47 positions shown, 19 images · non-contrast
Comparison: 10/16/2015

CLINICAL DATA: Syncope

EXAM:
CT HEAD WITHOUT CONTRAST
TECHNIQUE: Contiguous axial images were obtained from the base of the skull
through the vertex without intravenous contrast.

[Series 2: head wo · axial · 0.45mm/px · z∈[+1517,+1652]mm · 10 of 33 slices shown, 13 images]
[im 3/33  brain]
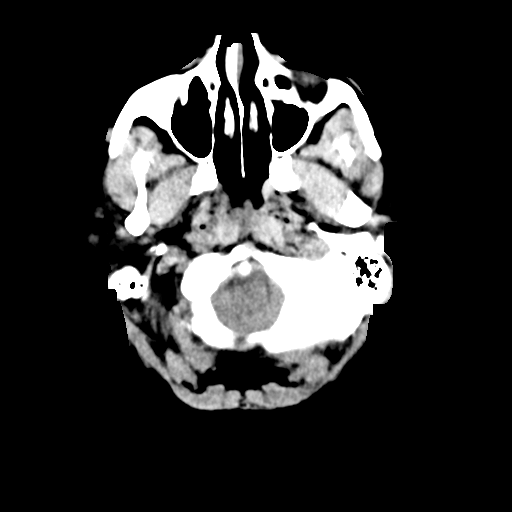
[im 3/33  bone]
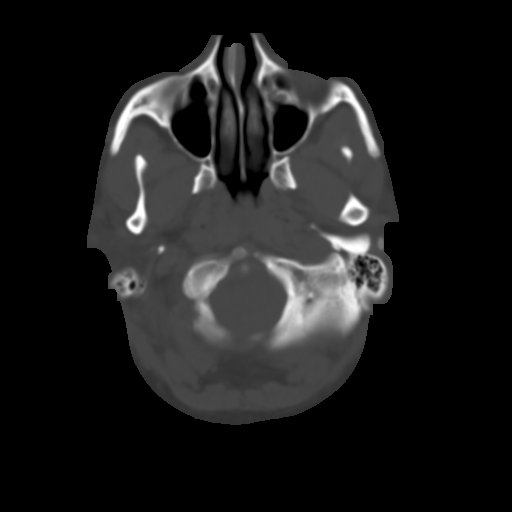
[im 6/33  brain]
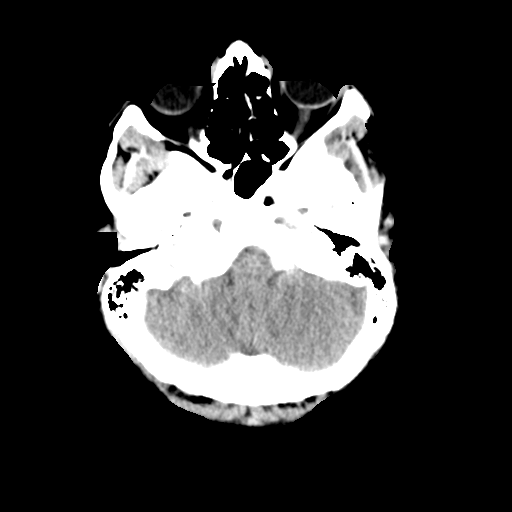
[im 9/33  brain]
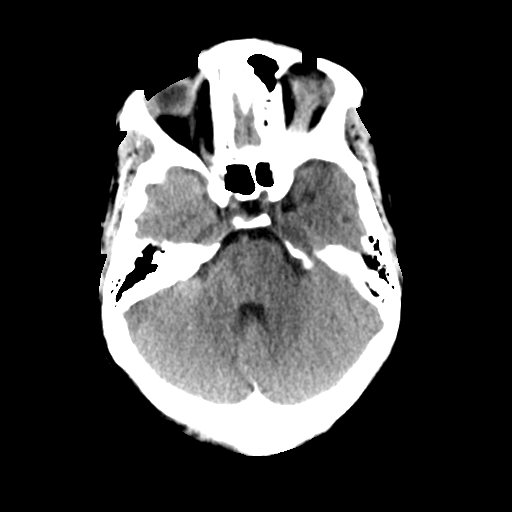
[im 12/33  brain]
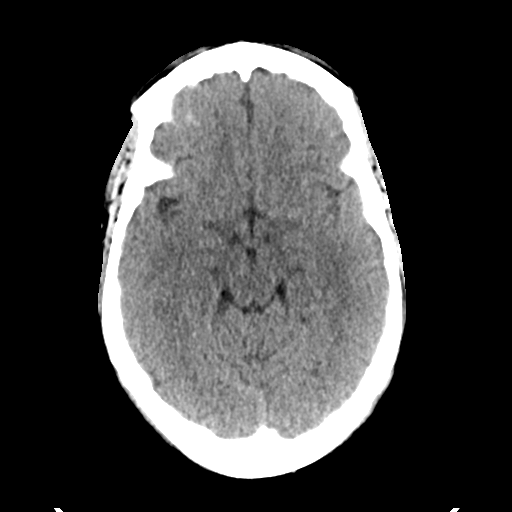
[im 15/33  brain]
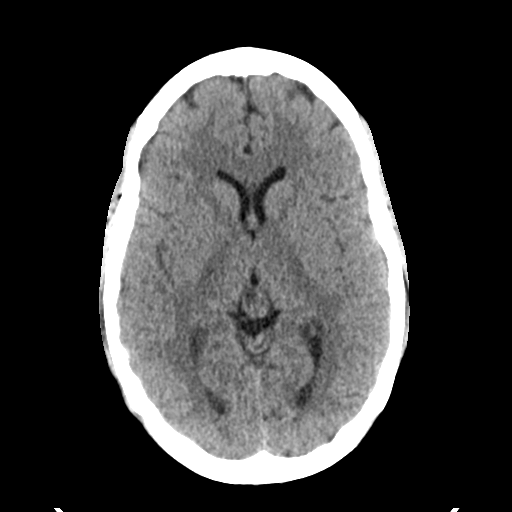
[im 15/33  bone]
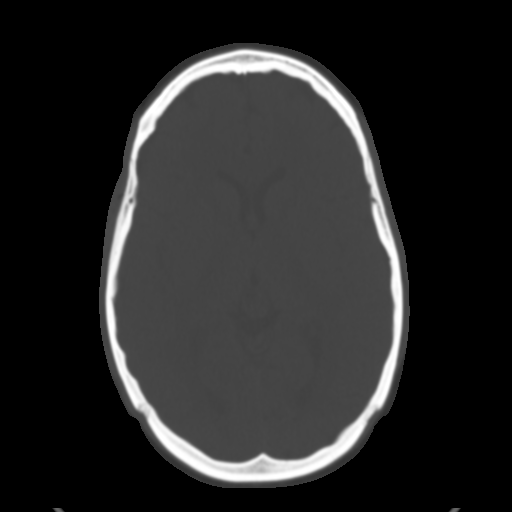
[im 18/33  brain]
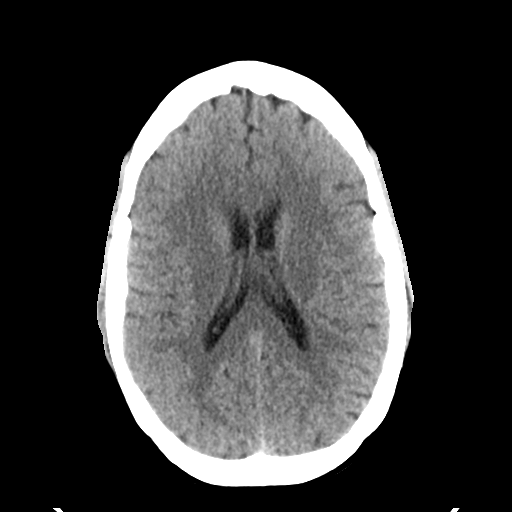
[im 21/33  brain]
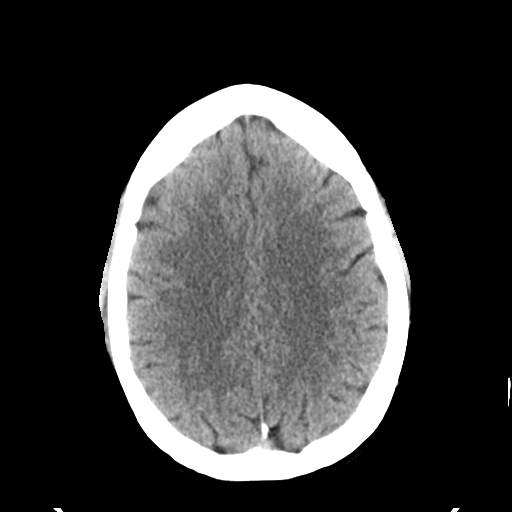
[im 25/33  brain]
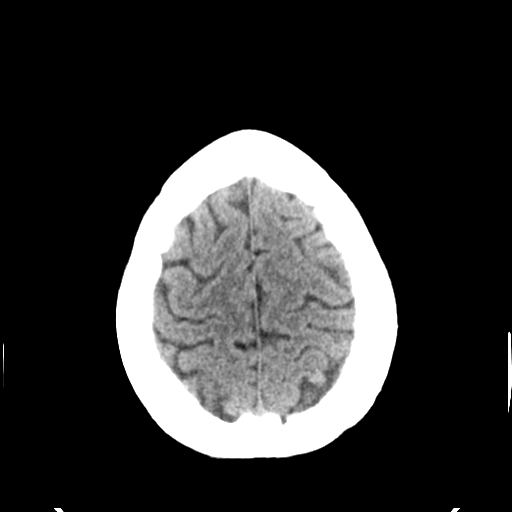
[im 27/33  brain]
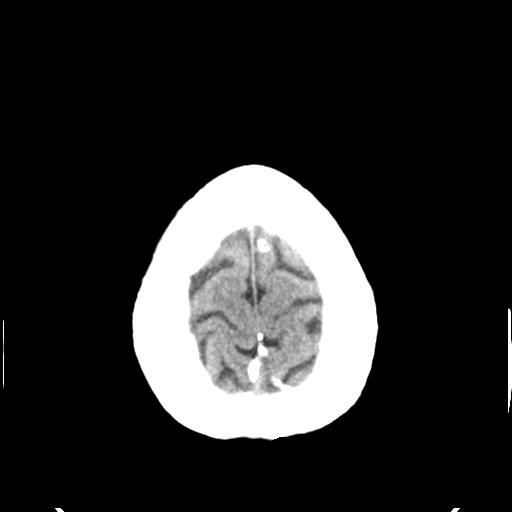
[im 27/33  bone]
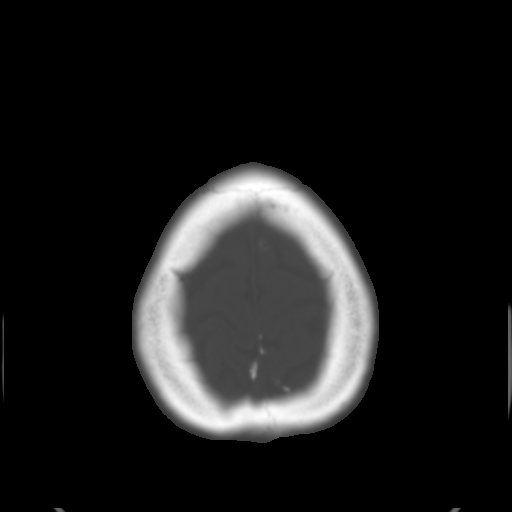
[im 30/33  brain]
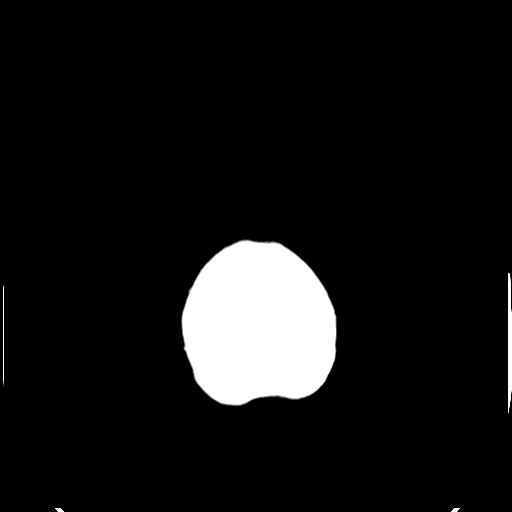

[Series 4: coronal soft tissue · coronal · 0.33mm/px · 3 of 73 slices shown]
[im 25/73  brain]
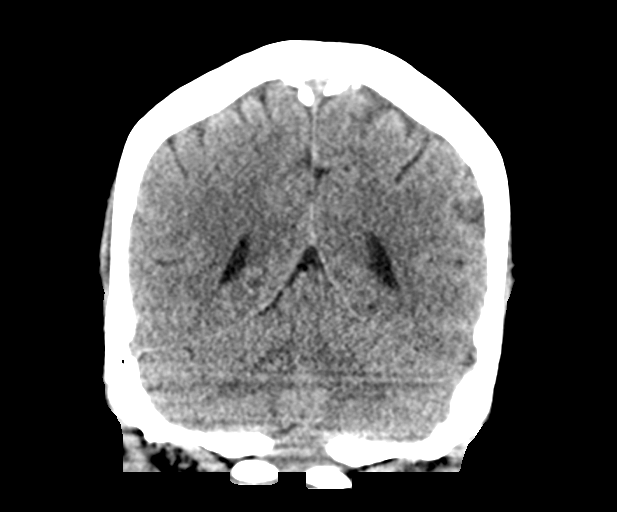
[im 33/73  brain]
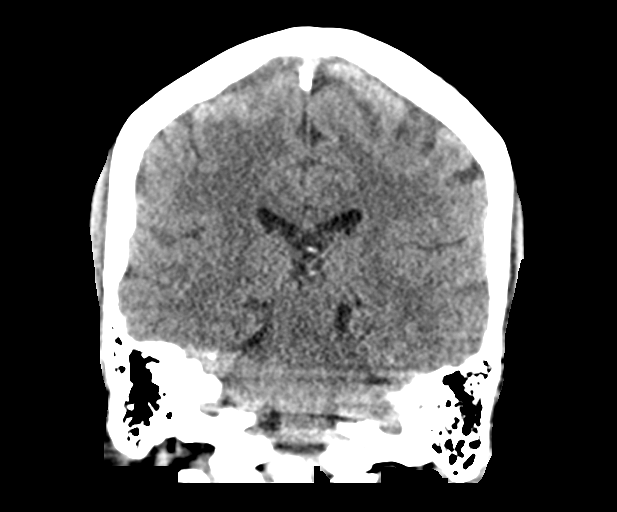
[im 41/73  brain]
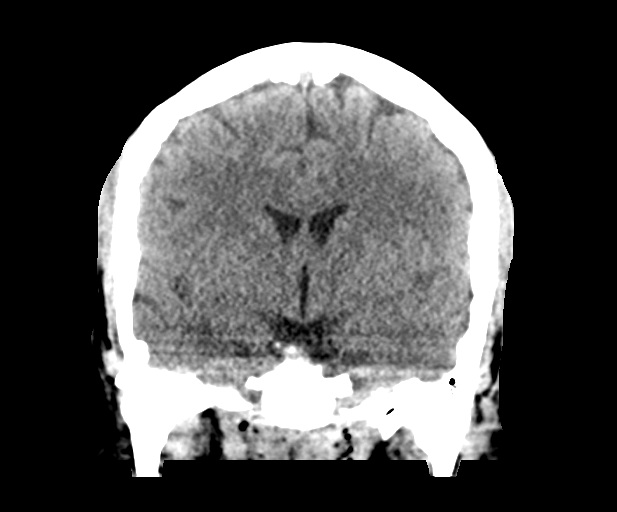

[Series 5: sagittal soft tissue · sagittal · 0.37mm/px · 3 of 57 slices shown]
[im 19/57  brain]
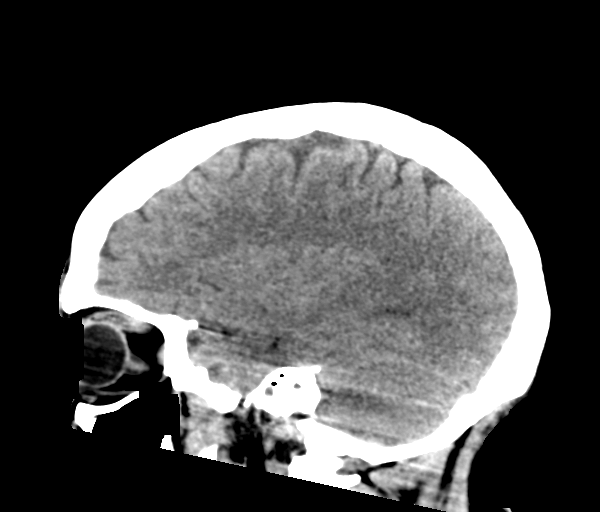
[im 29/57  brain]
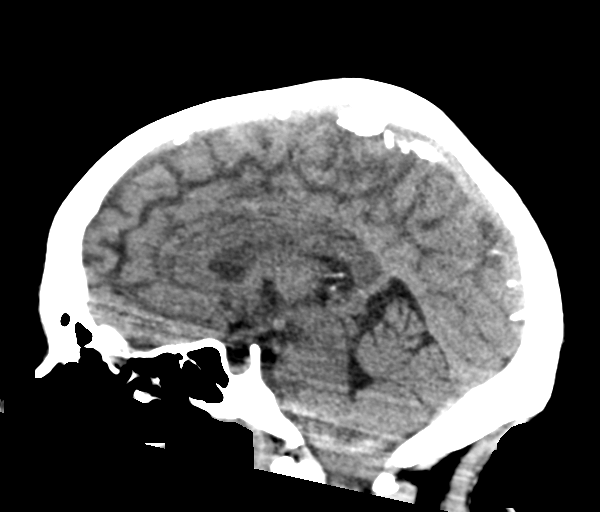
[im 38/57  brain]
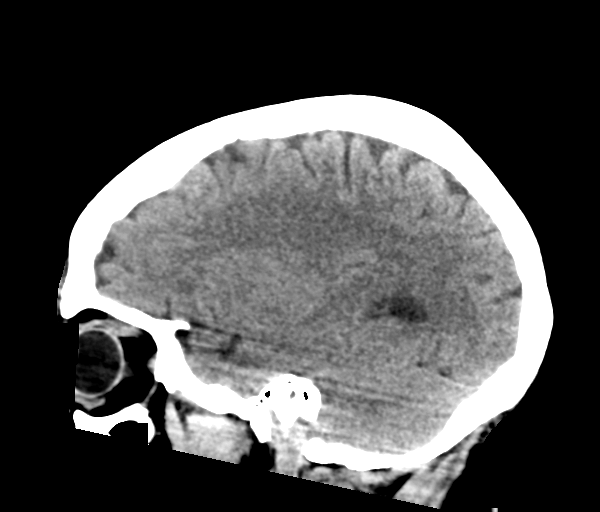

[16 of 47 positions shown; findings below may reference images not displayed]

FINDINGS: Brain: No acute intracranial abnormality. Specifically, no
hemorrhage, hydrocephalus, mass lesion, acute infarction, or
significant intracranial injury.

Vascular: No hyperdense vessel or unexpected calcification.

Skull: No acute calvarial abnormality.

Sinuses/Orbits: Visualized paranasal sinuses and mastoids clear.
Orbital soft tissues unremarkable.

Other: None
IMPRESSION: No intracranial abnormality.

## 2019-04-22 ENCOUNTER — Emergency Department (HOSPITAL_COMMUNITY)
Admission: EM | Admit: 2019-04-22 | Discharge: 2019-04-22 | Disposition: A | Discharge status: Home / Self Care | Payer: Medicaid Other | Attending: Emergency Medicine | Admitting: Emergency Medicine

## 2019-04-22 ENCOUNTER — Other Ambulatory Visit: Payer: Self-pay

## 2019-04-22 ENCOUNTER — Encounter (HOSPITAL_COMMUNITY): Payer: Self-pay | Admitting: Emergency Medicine

## 2019-04-22 DIAGNOSIS — R112 Nausea with vomiting, unspecified: Secondary | ICD-10-CM | POA: Insufficient documentation

## 2019-04-22 DIAGNOSIS — F1290 Cannabis use, unspecified, uncomplicated: Secondary | ICD-10-CM | POA: Insufficient documentation

## 2019-04-22 DIAGNOSIS — J45909 Unspecified asthma, uncomplicated: Secondary | ICD-10-CM | POA: Insufficient documentation

## 2019-04-22 DIAGNOSIS — T887XXA Unspecified adverse effect of drug or medicament, initial encounter: Secondary | ICD-10-CM

## 2019-04-22 MED ORDER — ONDANSETRON 4 MG PO TBDP
4.0000 mg | ORAL_TABLET | Freq: Once | ORAL | Status: AC
  Administered 2019-04-22: 4 mg via ORAL
  Filled 2019-04-22: qty 1

## 2019-04-22 MED ORDER — ONDANSETRON HCL 4 MG PO TABS: 4.0000 mg | ORAL_TABLET | Freq: Four times a day (QID) | ORAL | 0 refills | Status: DC

## 2019-04-22 NOTE — ED Notes (Signed)
Pt awake and alert on cell phone texting

## 2019-04-22 NOTE — ED Triage Notes (Signed)
Pt consumed some edible thc gummies.  She ate 3 of them that had 33 mg thc per gummy.  Pt feels paranoid, lethargic, nauseated and feels like heart is racing.

## 2019-04-22 NOTE — ED Provider Notes (Signed)
[facility] EMERGENCY DEPARTMENT Provider Note   CSN: [id number] Arrival date & time: 04/22/19  1527     History No chief complaint on file.   Kathryn Harris is a 29 y.o. female.  HPI   This patient is a 29 year old female, she has only a medical history of asthma.  She reports that she does not use drugs drink smoke or use any marijuana however today she tried to use some tetrahydrocannabinol edibles.  Within 30 minutes of eating these she became nauseated vomited and is felt extremely fatigued since that time.  She denies chest pain palpitation shortness of breath or any other symptoms.  The symptoms are persistent, they have been going on for a couple of hours, nothing seems to make this better or worse, she has never eaten anything like this before  Past Medical History:  Diagnosis Date  . Asthma     There are no problems to display for this patient.   History reviewed. No pertinent surgical history.   OB History    Gravida      Para      Term      Preterm      AB      Living  0     SAB      TAB      Ectopic      Multiple      Live Births              Family History  Problem Relation Age of Onset  . Hypertension Father   . Diabetes Other     Social History   Tobacco Use  . Smoking status: Never Smoker  . Smokeless tobacco: Former Engineer, water Use Topics  . Alcohol use: Not Currently    Comment: occasionally  . Drug use: Not Currently    Types: Marijuana    Home Medications Prior to Admission medications   Medication Sig Start Date End Date Taking? Authorizing Provider  ondansetron (ZOFRAN) 4 MG tablet Take 1 tablet (4 mg total) by mouth every 6 (six) hours. 04/22/19   Eber Hong, MD    Allergies    Patient has no known allergies.  Review of Systems   Review of Systems  All other systems reviewed and are negative.   Physical Exam Updated Vital Signs BP 132/82   Pulse 62   Temp (!) 97.5 F (36.4 C) (Oral)    Resp 19   Ht 1.651 m (5\' 5" )   Wt 135.2 kg   LMP 03/22/2019   SpO2 99%   BMI 49.59 kg/m   Physical Exam Vitals and nursing note reviewed.  Constitutional:      General: She is not in acute distress.    Appearance: She is well-developed.  HENT:     Head: Normocephalic and atraumatic.     Mouth/Throat:     Pharynx: No oropharyngeal exudate.  Eyes:     General: No scleral icterus.       Right eye: No discharge.        Left eye: No discharge.     Conjunctiva/sclera: Conjunctivae normal.     Pupils: Pupils are equal, round, and reactive to light.  Neck:     Thyroid: No thyromegaly.     Vascular: No JVD.  Cardiovascular:     Rate and Rhythm: Normal rate and regular rhythm.     Heart sounds: Normal heart sounds. No murmur. No friction rub. No gallop.   Pulmonary:  Effort: Pulmonary effort is normal. No respiratory distress.     Breath sounds: Normal breath sounds. No wheezing or rales.  Abdominal:     General: Bowel sounds are normal. There is no distension.     Palpations: Abdomen is soft. There is no mass.     Tenderness: There is no abdominal tenderness.  Musculoskeletal:        General: No tenderness. Normal range of motion.     Cervical back: Normal range of motion and neck supple.  Lymphadenopathy:     Cervical: No cervical adenopathy.  Skin:    General: Skin is warm and dry.     Findings: No erythema or rash.  Neurological:     Mental Status: She is alert.     Coordination: Coordination normal.     Comments: The patient is fatigued and sleepy but when asked to sit on the edge of the bed she is able to perform this without any difficulties.  She is moving slowly but able to have normal strength sensation and coordination.  Cranial nerves III through XII are normal, pupils are normal and reactive and symmetrical  Psychiatric:        Behavior: Behavior normal.     ED Results / Procedures / Treatments   Labs (all labs ordered are listed, but only abnormal results  are displayed) Labs Reviewed - No data to display  EKG None  Radiology No results found.  Procedures Procedures (including critical care time)  Medications Ordered in ED Medications  ondansetron (ZOFRAN-ODT) disintegrating tablet 4 mg (4 mg Oral Given 04/22/19 1629)    ED Course  I have reviewed the triage vital signs and the nursing notes.  Pertinent labs & imaging results that were available during my care of the patient were reviewed by me and considered in my medical decision making (see chart for details).    MDM Rules/Calculators/A&P                       This patient presents to the ED for concern of fatigue nausea and vomiting, this involves an extensive number of treatment options, and is a complaint that carries with it a high risk of complications and morbidity.  The differential diagnosis includes reaction to THC-containing substance, much less likely to be related to intracranial hemorrhage given the absence of focal neurologic findings, headache or changes in vision.  Less likely related to trauma, infection or metabolic abnormalities given that she was totally normal prior to eating the substances   Lab tests were not ordered as this patient had a very clear history of an ingestion that would likely cause her symptoms, she spontaneously improved  Medicines ordered:   I ordered medication Zofran for nausea  Additional history obtained:   Additional history obtained from medical records and the patient  Previous records obtained and reviewed   Consultations Obtained:   I consulted with the patient and discussed the treatment plan and medications to be used  Reevaluation:  After the interventions stated above, I reevaluated the patient and found the patient was able to ambulate to the bathroom back-and-forth without any difficulty, normal level of alertness, stable for discharge  Critical Interventions:  . Treated the patient symptomatic nausea,  counseled the patient at length regarding the need to avoid substances that might make her nauseated such as cannabinoid type substances, she accepted this advice and is agreeable to follow-up as needed   Final Clinical Impression(s) / ED Diagnoses Final  diagnoses:  Side effect of drug    Rx / DC Orders ED Discharge Orders         Ordered    ondansetron (ZOFRAN) 4 MG tablet  Every 6 hours     04/22/19 1852           Eber Hong, MD 04/22/19 [phone]

## 2019-04-22 NOTE — ED Notes (Signed)
Pt ambulatory to waiting room. Pt verbalized understanding of discharge instructions.   

## 2019-04-22 NOTE — Discharge Instructions (Signed)
Your symptoms are likely related to the edibles Please do not use anymore, do not use any forms of marijuana as it may make you very sick You may take ondansetron, this is a nausea medicine that you can take once every 6 hours as needed Please drink plenty of fluids Return to the emergency department for any severe or worsening symptoms

## 2019-09-06 ENCOUNTER — Ambulatory Visit: Payer: Self-pay

## 2020-01-11 ENCOUNTER — Encounter (HOSPITAL_COMMUNITY): Payer: Self-pay | Admitting: Emergency Medicine

## 2020-01-11 ENCOUNTER — Other Ambulatory Visit: Payer: Self-pay

## 2020-01-11 ENCOUNTER — Emergency Department (HOSPITAL_COMMUNITY)
Admission: EM | Admit: 2020-01-11 | Discharge: 2020-01-11 | Disposition: A | Discharge status: Home / Self Care | Payer: Medicaid Other | Attending: Emergency Medicine | Admitting: Emergency Medicine

## 2020-01-11 DIAGNOSIS — Z20822 Contact with and (suspected) exposure to covid-19: Secondary | ICD-10-CM | POA: Insufficient documentation

## 2020-01-11 DIAGNOSIS — J45901 Unspecified asthma with (acute) exacerbation: Secondary | ICD-10-CM | POA: Insufficient documentation

## 2020-01-11 MED ORDER — ALBUTEROL SULFATE (2.5 MG/3ML) 0.083% IN NEBU: 5.0000 mg | INHALATION_SOLUTION | Freq: Four times a day (QID) | RESPIRATORY_TRACT | 0 refills | Status: DC | PRN

## 2020-01-11 MED ORDER — ALBUTEROL SULFATE HFA 108 (90 BASE) MCG/ACT IN AERS
8.0000 | INHALATION_SPRAY | Freq: Once | RESPIRATORY_TRACT | Status: AC
  Administered 2020-01-11: 8 via RESPIRATORY_TRACT
  Filled 2020-01-11: qty 6.7

## 2020-01-11 MED ORDER — ALBUTEROL SULFATE HFA 108 (90 BASE) MCG/ACT IN AERS: 2.0000 | INHALATION_SPRAY | RESPIRATORY_TRACT | 0 refills | Status: DC | PRN

## 2020-01-11 MED ORDER — PREDNISONE 50 MG PO TABS
60.0000 mg | ORAL_TABLET | Freq: Once | ORAL | Status: AC
  Administered 2020-01-11: 60 mg via ORAL
  Filled 2020-01-11: qty 1

## 2020-01-11 MED ORDER — ALBUTEROL SULFATE HFA 108 (90 BASE) MCG/ACT IN AERS
8.0000 | INHALATION_SPRAY | Freq: Once | RESPIRATORY_TRACT | Status: AC
  Administered 2020-01-11: 8 via RESPIRATORY_TRACT

## 2020-01-11 MED ORDER — PREDNISONE 20 MG PO TABS: ORAL_TABLET | ORAL | 0 refills | Status: DC

## 2020-01-11 MED ORDER — AEROCHAMBER Z-STAT PLUS/MEDIUM MISC
1.0000 | Freq: Once | Status: AC
  Administered 2020-01-11: 1

## 2020-01-11 NOTE — Discharge Instructions (Addendum)
Use your nebulizer 6 cc every 6 hours as needed for wheezing or shortness of breath. Use the inhaler for wheezing in between nebulizer treatments. Take the prednisone as prescribed. You got the 1st dose today in the ED. Recheck if you get fever, or ear wheezing and breathing gets a lot worse. 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

## 2020-01-11 NOTE — ED Notes (Signed)
Pt ambulatory to waiting room. Pt verbalized understanding of discharge instructions.   

## 2020-01-11 NOTE — ED Notes (Signed)
Pt reports giving self a breathing treatment at home PTA and using her inhaler without relief of her SOB. Pt reports Hx of Asthma, but has been around a lot of dogs lately and is wondering this could be contributing to symptoms at this time. Pt reports SOB for X 3 weeks.

## 2020-01-11 NOTE — ED Provider Notes (Signed)
[facility] EMERGENCY DEPARTMENT Provider Note   CSN: [id number] Arrival date & time: 01/11/20  0159   Time seen 4:45 AM  History Chief Complaint  Patient presents with  . Shortness of Breath    Kathryn Harris is a 30 y.o. female.  HPI   Patient states for the past 3 weeks she has had a dry cough and wheezing.  She states her inhaler helped with the shortness of breath but she still felt like it was hard to breathe.  She states it seems worse at night.  She denies fever, sore throat, rhinorrhea, or loss of taste or smell.  She states she had COVID in July 2020 and mainly had GI symptoms with vomiting and diarrhea and not respiratory.  Patient has a history of asthma since she was a child but reports this is her worst episode ever.  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  Her symptoms seem to start after she started working with a client.  Patient does not smoke.  Patient has not had the COVID-vaccine  PCP Gareth Morgan, MD   Past Medical History:  Diagnosis Date  . Asthma     There are no problems to display for this patient.   History reviewed. No pertinent surgical history.   OB History    Gravida      Para      Term      Preterm      AB      Living  0     SAB      IAB      Ectopic      Multiple      Live Births              Family History  Problem Relation Age of Onset  . Hypertension Father   . Diabetes Other     Social History   Tobacco Use  . Smoking status: Never Smoker  . Smokeless tobacco: Former Clinical biochemist  . Vaping Use: Never used  Substance Use Topics  . Alcohol use: Not Currently    Comment: occasionally  . Drug use: Not Currently    Types: Marijuana  Employed  Home Medications Prior to Admission medications   Medication Sig Start Date End Date Taking? Authorizing Provider  albuterol (PROVENTIL) (2.5 MG/3ML) 0.083% nebulizer  solution Take 6 mLs (5 mg total) by nebulization every 6 (six) hours as needed for wheezing or shortness of breath. 01/11/20  Yes Devoria Albe, MD  albuterol (VENTOLIN HFA) 108 (90 Base) MCG/ACT inhaler Inhale 2 puffs into the lungs every 4 (four) hours as needed. 01/11/20  Yes Devoria Albe, MD  predniSONE (DELTASONE) 20 MG tablet Take 3 po QD x 3d , then 2 po QD x 3d then 1 po QD x 3d 01/11/20  Yes Devoria Albe, MD  ondansetron (ZOFRAN) 4 MG tablet Take 1 tablet (4 mg total) by mouth every 6 (six) hours. 04/22/19   Eber Hong, MD    Allergies    Patient has no known allergies.  Review of Systems   Review of Systems  All other systems reviewed and are negative.   Physical Exam Updated Vital Signs BP 116/70   Pulse 80   Temp 98.4 F (36.9 C) (Oral)   Resp 20   Ht 5\' 5"  (1.651 m)   Wt 127 kg  LMP 11/21/2019   SpO2 100%   BMI 46.59 kg/m   Physical Exam Vitals and nursing note reviewed.  Constitutional:      General: She is not in acute distress.    Appearance: Normal appearance. She is obese. She is not ill-appearing or toxic-appearing.  HENT:     Head: Normocephalic and atraumatic.     Right Ear: External ear normal.     Left Ear: External ear normal.  Eyes:     Extraocular Movements: Extraocular movements intact.     Conjunctiva/sclera: Conjunctivae normal.     Pupils: Pupils are equal, round, and reactive to light.  Cardiovascular:     Rate and Rhythm: Normal rate and regular rhythm.     Pulses: Normal pulses.     Heart sounds: Normal heart sounds. No friction rub.  Pulmonary:     Effort: No tachypnea, accessory muscle usage, prolonged expiration or respiratory distress.     Breath sounds: Examination of the right-upper field reveals wheezing. Examination of the left-upper field reveals wheezing. Examination of the right-middle field reveals wheezing. Examination of the left-middle field reveals wheezing. Examination of the right-lower field reveals wheezing. Examination of the  left-lower field reveals wheezing. Decreased breath sounds and wheezing present. No rales.  Musculoskeletal:        General: Normal range of motion.     Cervical back: Normal range of motion.  Skin:    General: Skin is warm and dry.  Neurological:     General: No focal deficit present.     Mental Status: She is alert and oriented to person, place, and time.     Cranial Nerves: No cranial nerve deficit.  Psychiatric:        Mood and Affect: Mood normal.        Behavior: Behavior normal.        Thought Content: Thought content normal.     ED Results / Procedures / Treatments   Labs (all labs ordered are listed, but only abnormal results are displayed) Labs Reviewed  SARS CORONAVIRUS 2 (TAT 6-24 HRS)    EKG None  Radiology No results found.  Procedures Procedures (including critical care time)  Medications Ordered in ED Medications  albuterol (VENTOLIN HFA) 108 (90 Base) MCG/ACT inhaler 8 puff (has no administration in time range)  albuterol (VENTOLIN HFA) 108 (90 Base) MCG/ACT inhaler 8 puff (8 puffs Inhalation Given 01/11/20 0527)  aerochamber Z-Stat Plus/medium 1 each (1 each Other Given 01/11/20 0528)  predniSONE (DELTASONE) tablet 60 mg (60 mg Oral Given 01/11/20 0527)  albuterol (VENTOLIN HFA) 108 (90 Base) MCG/ACT inhaler 8 puff (8 puffs Inhalation Given 01/11/20 2993)    ED Course  I have reviewed the triage vital signs and the nursing notes.  Pertinent labs & imaging results that were available during my care of the patient were reviewed by me and considered in my medical decision making (see chart for details).    MDM Rules/Calculators/A&P                         Patient was given albuterol 8 puffs by inhaler.  She was given a spacer.  She also was given prednisone 60 mg orally.   Recheck at 6:10 AM patient states she is only minimally better.  Relates them to her she still has diffuse scattered wheezing.  She was given a second round of 8 puffs of albuterol by  inhalation.  Recheck at 7:00 AM patient was sleeping.  She states she is feeling a little better but still wheezing. When I listen to her she still has some scattered wheezing but they are getting lower pitched now. She was given 8 more puffs by inhaler. Patient states she wants to go home. She states she has a family member with a nebulizer. She was given a mask for using the nebulizer and I will write her albuterol liquid for the nebulizer for her to use. She again states she has not had any fevers. She was discharged home with prednisone.   Final Clinical Impression(s) / ED Diagnoses Final diagnoses:  Moderate asthma with exacerbation, unspecified whether persistent    Rx / DC Orders ED Discharge Orders         Ordered    predniSONE (DELTASONE) 20 MG tablet        01/11/20 0712    albuterol (PROVENTIL) (2.5 MG/3ML) 0.083% nebulizer solution  Every 6 hours PRN        01/11/20 0712    albuterol (VENTOLIN HFA) 108 (90 Base) MCG/ACT inhaler  Every 4 hours PRN        01/11/20 1448         Plan discharge  Devoria Albe, MD, Concha Pyo, MD 01/11/20 [phone]

## 2020-01-11 NOTE — ED Triage Notes (Signed)
Pt with c/o SOB x 3 weeks. Pt has not notified PCP of this. States "it woke her from her sleep tonight and she was wheezing". Pt with hx of asthma.

## 2020-01-12 LAB — SARS CORONAVIRUS 2 (TAT 6-24 HRS): SARS Coronavirus 2: NEGATIVE

## 2020-11-09 ENCOUNTER — Emergency Department (HOSPITAL_COMMUNITY)
Admission: EM | Admit: 2020-11-09 | Discharge: 2020-11-09 | Disposition: A | Discharge status: Home / Self Care | Payer: 59 | Attending: Emergency Medicine | Admitting: Emergency Medicine

## 2020-11-09 ENCOUNTER — Other Ambulatory Visit: Payer: Self-pay

## 2020-11-09 DIAGNOSIS — Z7951 Long term (current) use of inhaled steroids: Secondary | ICD-10-CM | POA: Insufficient documentation

## 2020-11-09 DIAGNOSIS — T22211A Burn of second degree of right forearm, initial encounter: Secondary | ICD-10-CM | POA: Diagnosis present

## 2020-11-09 DIAGNOSIS — Z87891 Personal history of nicotine dependence: Secondary | ICD-10-CM | POA: Diagnosis not present

## 2020-11-09 DIAGNOSIS — J45909 Unspecified asthma, uncomplicated: Secondary | ICD-10-CM | POA: Diagnosis not present

## 2020-11-09 DIAGNOSIS — T310 Burns involving less than 10% of body surface: Secondary | ICD-10-CM | POA: Diagnosis not present

## 2020-11-09 DIAGNOSIS — Z7952 Long term (current) use of systemic steroids: Secondary | ICD-10-CM | POA: Insufficient documentation

## 2020-11-09 DIAGNOSIS — X19XXXA Contact with other heat and hot substances, initial encounter: Secondary | ICD-10-CM | POA: Insufficient documentation

## 2020-11-09 MED ORDER — BACITRACIN ZINC 500 UNIT/GM EX OINT: 1.0000 "application " | TOPICAL_OINTMENT | Freq: Two times a day (BID) | CUTANEOUS | 0 refills | Status: DC

## 2020-11-09 MED ORDER — BACITRACIN ZINC 500 UNIT/GM EX OINT
TOPICAL_OINTMENT | Freq: Once | CUTANEOUS | Status: AC
  Filled 2020-11-09: qty 0.9

## 2020-11-09 NOTE — ED Triage Notes (Signed)
Pt arrived POV complaining of burn to right arm. Occurred at about 0940 Pt states she was at work and burned herself with a hot glue gun. At work the sprayed arm with burn spray that they found in first aid kit.  Pain is 8/10   Pt is alert and oriented. No other injuries

## 2020-11-09 NOTE — Discharge Instructions (Addendum)
If you develop redness, fever, new or worsening pain, or any other new/concerning symptoms then return to the ER for evaluation.
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
## 2021-03-18 ENCOUNTER — Encounter: Payer: Medicaid Other | Admitting: Women's Health

## 2021-04-04 ENCOUNTER — Other Ambulatory Visit (HOSPITAL_COMMUNITY)
Admission: RE | Admit: 2021-04-04 | Discharge: 2021-04-04 | Disposition: A | Discharge status: Home / Self Care | Payer: 59 | Source: Ambulatory Visit | Attending: Women's Health | Admitting: Women's Health

## 2021-04-04 ENCOUNTER — Encounter: Payer: Self-pay | Admitting: Women's Health

## 2021-04-04 ENCOUNTER — Ambulatory Visit (INDEPENDENT_AMBULATORY_CARE_PROVIDER_SITE_OTHER): Payer: 59 | Admitting: Women's Health

## 2021-04-04 VITALS — BP 131/73 | HR 96 | Ht 65.0 in | Wt 318.8 lb

## 2021-04-04 DIAGNOSIS — Z113 Encounter for screening for infections with a predominantly sexual mode of transmission: Secondary | ICD-10-CM | POA: Insufficient documentation

## 2021-04-04 DIAGNOSIS — R399 Unspecified symptoms and signs involving the genitourinary system: Secondary | ICD-10-CM

## 2021-04-04 DIAGNOSIS — H18603 Keratoconus, unspecified, bilateral: Secondary | ICD-10-CM

## 2021-04-04 DIAGNOSIS — Z124 Encounter for screening for malignant neoplasm of cervix: Secondary | ICD-10-CM | POA: Insufficient documentation

## 2021-04-04 DIAGNOSIS — N926 Irregular menstruation, unspecified: Secondary | ICD-10-CM

## 2021-04-04 DIAGNOSIS — Z7251 High risk heterosexual behavior: Secondary | ICD-10-CM | POA: Diagnosis not present

## 2021-04-04 DIAGNOSIS — N898 Other specified noninflammatory disorders of vagina: Secondary | ICD-10-CM

## 2021-04-04 HISTORY — DX: Keratoconus, unspecified, bilateral: H18.603

## 2021-04-04 LAB — POCT URINALYSIS DIPSTICK OB
Blood, UA: NEGATIVE
Glucose, UA: NEGATIVE
Ketones, UA: NEGATIVE
Leukocytes, UA: NEGATIVE
Nitrite, UA: NEGATIVE
POC,PROTEIN,UA: NEGATIVE

## 2021-04-04 NOTE — Addendum Note (Signed)
Addended by: Dorita Sciara, Douglas Smolinsky A on: 04/04/2021 03:23 PM ? ? Modules accepted: Orders ? ?

## 2021-04-04 NOTE — Progress Notes (Signed)
? ?GYN VISIT ?Patient name: Kathryn Harris MRN [id number]  Date of birth: [birth year] ?Chief Complaint:   ?Gynecologic Exam, Annual Exam, and Urinary Tract Infection ? ?History of Present Illness:   ?Kathryn Harris is a 31 y.o. G0P0000 African-American female being seen today for abnormal periods. One period monthly, around same time, lasts 7-8d, at heaviest changes pad about 6x/day, never qhr. Does have some clots, about quarter-sized. Cramps day before period goes off. When she remembers to take her vitamins (takes zinc, magnesium, etc)- periods are much better, but sometimes is 'lazy and forgets'. Doesn't want birth control to help with periods. Hasn't been on birth control since 2010- was on pills then. Sexually active w/ same partner on & off since 2019- he went to prison 2020 and got out Aug 2021- has had unprotected sex since and not pregnant. He is 32yo and has never gotten anyone pregnant she is aware of. Just thinks she can't get pregnant. Pt does not want to actively try for a pregnancy right now, but is ok if it happens.  Some vaginal odor, thinks she may have BV. Had some UTI sx over weekend, took otc AZO and sx gone.  ? ?Patient's last menstrual period was 03/17/2021 (exact date). ?The current method of family planning is none.  ?Last pap ~2020. Results were: negative per pt report at [facility] , does have h/o abnormal pap w/ colpo prior to this ? ? ?  04/04/2021  ?  1:47 PM  ?Depression screen PHQ 2/9  ?Decreased Interest 2  ?Down, Depressed, Hopeless 0  ?PHQ - 2 Score 2  ?Altered sleeping 0  ?Tired, decreased energy 3  ?Change in appetite 0  ?Feeling bad or failure about yourself  0  ?Trouble concentrating 0  ?Moving slowly or fidgety/restless 0  ?Suicidal thoughts 0  ?PHQ-9 Score 5  ? ?  ? ?  04/04/2021  ?  1:49 PM  ?GAD 7 : Generalized Anxiety Score  ?Nervous, Anxious, on Edge 0  ?Control/stop worrying 0  ?Worry too much - different things 1  ?Trouble relaxing 1  ?Restless 0  ?Easily annoyed or  irritable 1  ?Afraid - awful might happen 0  ?Total GAD 7 Score 3  ? ? ? ?Review of Systems:   ?Pertinent items are noted in HPI ?Denies fever/chills, dizziness, headaches, visual disturbances, fatigue, shortness of breath, chest pain, abdominal pain, vomiting, abnormal vaginal discharge/itching/odor/irritation, problems with periods, bowel movements, urination, or intercourse unless otherwise stated above.  ?Pertinent History Reviewed:  ?Reviewed past medical,surgical, social, obstetrical and family history.  ?Reviewed problem list, medications and allergies. ?Physical Assessment:  ? ?Vitals:  ? 04/04/21 1359  ?BP: 131/73  ?Pulse: 96  ?Weight: (!) 318 lb 12.8 oz (144.6 kg)  ?Height: 5\' 5"  (1.651 m)  ?Body mass index is 53.05 kg/m?. ? ?     Physical Examination:  ? General appearance: alert, well appearing, and in no distress ? Mental status: alert, oriented to person, place, and time ? Skin: warm & dry  ? Cardiovascular: normal heart rate noted ? Respiratory: normal respiratory effort, no distress ? Abdomen: soft, non-tender  ? Pelvic: VULVA: normal appearing vulva with no masses, tenderness or lesions, VAGINA: normal appearing vagina with normal color and discharge, no lesions, CERVIX: anterior, normal appearing cervix without discharge or lesions, UTERUS: uterus is normal size, shape, consistency and nontender, ADNEXA: normal adnexa in size, nontender and no masses ? Extremities: no edema  ? ?Chaperone: Neas   ? ?Results for  orders placed or performed in visit on 04/04/21 (from the past 24 hour(s))  ?POC Urinalysis Dipstick OB  ? Collection Time: 04/04/21  2:05 PM  ?Result Value Ref Range  ? Color, UA    ? Clarity, UA    ? Glucose, UA Negative Negative  ? Bilirubin, UA    ? Ketones, UA neg   ? Spec Grav, UA    ? Blood, UA neg   ? pH, UA    ? POC,PROTEIN,UA Negative Negative, Trace, Small (1+), Moderate (2+), Large (3+), 4+  ? Urobilinogen, UA    ? Nitrite, UA neg   ? Leukocytes, UA Negative Negative  ?  Appearance    ? Odor    ?  ?Assessment & Plan:  ?1) Slightly long/heavy periods> improves when she takes her vitamins, declines birth control. Let me know if changes mind.  ? ?2) Unprotected sex> since 2010, consistently w/ same partner since Aug 2021 and not pregnant. Does not want to actively try to get pregnant, but is ok if she does. If she decides she wants to actively try for pregnancy, let us know ? ?3) Cervical cancer screening> pap today, get previous records from [facility] ? ?4) STD screen ? ?5) Vaginal odor> will see if pap shows anything ? ?6) Recent UTI sx> resolved w/ otc AZO, urine dipstick neg today ? ?Meds: No orders of the defined types were placed in this encounter. ? ? ?Orders Placed This Encounter  ?Procedures  ? POC Urinalysis Dipstick OB  ? ? ?Return in about 1 year (around 04/05/2022) for Physical; get pap records from [facility]. ? ?Cheral Marker CNM, WHNP-BC ?04/04/2021 ?2:39 PM  ?

## 2021-04-06 LAB — CYTOLOGY - PAP
Chlamydia: NEGATIVE
Comment: NEGATIVE
Comment: NEGATIVE
Comment: NORMAL
Diagnosis: NEGATIVE
Diagnosis: REACTIVE
High risk HPV: NEGATIVE
Neisseria Gonorrhea: NEGATIVE

## 2021-09-26 DIAGNOSIS — H4710 Unspecified papilledema: Secondary | ICD-10-CM | POA: Diagnosis not present

## 2021-09-26 DIAGNOSIS — H18623 Keratoconus, unstable, bilateral: Secondary | ICD-10-CM | POA: Diagnosis not present

## 2021-09-30 DIAGNOSIS — G932 Benign intracranial hypertension: Secondary | ICD-10-CM | POA: Diagnosis not present

## 2021-09-30 DIAGNOSIS — Q048 Other specified congenital malformations of brain: Secondary | ICD-10-CM | POA: Diagnosis not present

## 2021-09-30 DIAGNOSIS — R519 Headache, unspecified: Secondary | ICD-10-CM | POA: Diagnosis not present

## 2021-09-30 DIAGNOSIS — H4710 Unspecified papilledema: Secondary | ICD-10-CM | POA: Diagnosis not present

## 2021-10-05 DIAGNOSIS — H4710 Unspecified papilledema: Secondary | ICD-10-CM | POA: Diagnosis not present

## 2022-01-04 DIAGNOSIS — H4710 Unspecified papilledema: Secondary | ICD-10-CM | POA: Diagnosis not present

## 2022-01-04 DIAGNOSIS — E348 Other specified endocrine disorders: Secondary | ICD-10-CM | POA: Diagnosis not present

## 2022-01-30 ENCOUNTER — Telehealth: Payer: Self-pay | Admitting: Women's Health

## 2022-01-30 NOTE — Telephone Encounter (Signed)
Spoke with patient advised her to continue to monitor symptoms and if she goes 3 mo with no period to call us for an appointment. Pt agreeable.

## 2022-01-30 NOTE — Telephone Encounter (Signed)
Patient's period is 13 days late and has took pregnancy tests with negative results. Please advise.

## 2022-02-28 DIAGNOSIS — E348 Other specified endocrine disorders: Secondary | ICD-10-CM | POA: Diagnosis not present

## 2022-02-28 DIAGNOSIS — G932 Benign intracranial hypertension: Secondary | ICD-10-CM | POA: Diagnosis not present

## 2022-02-28 DIAGNOSIS — G4720 Circadian rhythm sleep disorder, unspecified type: Secondary | ICD-10-CM | POA: Diagnosis not present

## 2022-02-28 DIAGNOSIS — F518 Other sleep disorders not due to a substance or known physiological condition: Secondary | ICD-10-CM | POA: Diagnosis not present

## 2022-04-25 ENCOUNTER — Ambulatory Visit: Payer: Self-pay | Admitting: *Deleted
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
## 2022-05-10 DIAGNOSIS — H18623 Keratoconus, unstable, bilateral: Secondary | ICD-10-CM | POA: Diagnosis not present

## 2022-06-28 DIAGNOSIS — H4710 Unspecified papilledema: Secondary | ICD-10-CM | POA: Diagnosis not present

## 2022-06-28 DIAGNOSIS — H18623 Keratoconus, unstable, bilateral: Secondary | ICD-10-CM | POA: Diagnosis not present

## 2022-07-12 DIAGNOSIS — H18623 Keratoconus, unstable, bilateral: Secondary | ICD-10-CM | POA: Diagnosis not present

## 2022-08-02 DIAGNOSIS — G4733 Obstructive sleep apnea (adult) (pediatric): Secondary | ICD-10-CM | POA: Diagnosis not present

## 2022-08-02 DIAGNOSIS — G4720 Circadian rhythm sleep disorder, unspecified type: Secondary | ICD-10-CM | POA: Diagnosis not present

## 2022-08-02 DIAGNOSIS — F518 Other sleep disorders not due to a substance or known physiological condition: Secondary | ICD-10-CM | POA: Diagnosis not present

## 2022-08-02 DIAGNOSIS — G932 Benign intracranial hypertension: Secondary | ICD-10-CM | POA: Diagnosis not present

## 2022-09-06 ENCOUNTER — Ambulatory Visit (INDEPENDENT_AMBULATORY_CARE_PROVIDER_SITE_OTHER): Payer: 59 | Admitting: Obstetrics & Gynecology

## 2022-09-06 ENCOUNTER — Other Ambulatory Visit (HOSPITAL_COMMUNITY)
Admission: RE | Admit: 2022-09-06 | Discharge: 2022-09-06 | Disposition: A | Discharge status: Home / Self Care | Payer: 59 | Source: Ambulatory Visit | Attending: Obstetrics & Gynecology | Admitting: Obstetrics & Gynecology

## 2022-09-06 ENCOUNTER — Encounter: Payer: Self-pay | Admitting: Obstetrics & Gynecology

## 2022-09-06 VITALS — BP 128/84 | HR 69 | Ht 65.0 in | Wt 316.0 lb

## 2022-09-06 DIAGNOSIS — N898 Other specified noninflammatory disorders of vagina: Secondary | ICD-10-CM

## 2022-09-06 DIAGNOSIS — Z3009 Encounter for other general counseling and advice on contraception: Secondary | ICD-10-CM

## 2022-09-06 DIAGNOSIS — Z01419 Encounter for gynecological examination (general) (routine) without abnormal findings: Secondary | ICD-10-CM

## 2022-09-06 NOTE — Progress Notes (Signed)
WELL-WOMAN EXAMINATION Patient name: Kathryn Harris MRN [id number]  Date of birth: [birth year] Chief Complaint:   Gynecologic Exam  History of Present Illness:   Kathryn Harris is a 32 y.o. G0P0000 female being seen today for a routine well-woman exam.   On occasion will have a heavy period, but overall tolerable.  Menses typically a week (mostly 4-5 days).  Notes moderate bleeding, but overall tolerable.  Notes vaginal odor with slight discharge- going on for about a week.  Sexually active with same partner for several years without pregnancy.  Would desire a pregnancy, but reports that she does not think she can get pregnant.  Current partner has not fathered a child.  Prior partner- no pregnancy and he does have children.  She does note h/o Chlamydia when she was younger.     Patient's last menstrual period was 08/22/2022. Denies issues with her menses The current method of family planning is none.    Last pap 04/2021.  Last mammogram: NA. Last colonoscopy: NA     09/06/2022    9:25 AM 04/04/2021    1:47 PM  Depression screen PHQ 2/9  Decreased Interest 1 2  Down, Depressed, Hopeless 1 0  PHQ - 2 Score 2 2  Altered sleeping 1 0  Tired, decreased energy 1 3  Change in appetite 1 0  Feeling bad or failure about yourself  1 0  Trouble concentrating 0 0  Moving slowly or fidgety/restless 0 0  Suicidal thoughts 0 0  PHQ-9 Score 6 5      Review of Systems:   Pertinent items are noted in HPI Denies any headaches, blurred vision, fatigue, shortness of breath, chest pain, abdominal pain, bowel movements, urination, or intercourse unless otherwise stated above.  Pertinent History Reviewed:  Reviewed past medical,surgical, social and family history.  Reviewed problem list, medications and allergies. Physical Assessment:   Vitals:   09/06/22 0925  BP: 128/84  Pulse: 69  Weight: (!) 316 lb (143.3 kg)  Height: 5\' 5"  (1.651 m)  Body mass index is 52.59 kg/m.         Physical Examination:   General appearance - well appearing, and in no distress  Mental status - alert, oriented to person, place, and time  Psych:  She has a normal mood and affect  Skin - warm and dry, normal color, no suspicious lesions noted  Chest - effort normal, all lung fields clear to auscultation bilaterally  Heart - normal rate and regular rhythm  Neck:  midline trachea, no thyromegaly or nodules  Breasts - breasts appear normal, no suspicious masses, no skin or nipple changes or  axillary nodes  Abdomen - obese, soft, nontender, nondistended, no masses or organomegaly  Pelvic - VULVA: normal appearing vulva with no masses, tenderness or lesions  VAGINA: normal appearing vagina with normal color and discharge, no lesions  CERVIX: normal appearing cervix without discharge or lesions, no CMT  UTERUS: uterus is felt to be normal size, shape, consistency and nontender   ADNEXA: No adnexal masses or tenderness noted- exam limited due to body habitus  Extremities:  No swelling or varicosities noted  Chaperone: [name]     Assessment & Plan:  1) Well-Woman Exam -pap up to date, reviewed screening guidelines  2) Vaginal odor -vaginitis panel obtained -Further management pending results  3) Family planning -Bring workup including labs, semen analysis, HSG -Based on prior history HSG -For now encouraged patient to track menses and have regular intercourse  during ovulation window  No orders of the defined types were placed in this encounter.   Meds: No orders of the defined types were placed in this encounter.   Follow-up: Return in about 1 year (around 09/06/2023) for Annual.   [name], DO Attending Obstetrician & Gynecologist, [facility] for [facility], [facility]

## 2022-09-07 DIAGNOSIS — H4710 Unspecified papilledema: Secondary | ICD-10-CM | POA: Diagnosis not present

## 2022-09-07 DIAGNOSIS — E348 Other specified endocrine disorders: Secondary | ICD-10-CM | POA: Diagnosis not present

## 2022-09-07 LAB — CERVICOVAGINAL ANCILLARY ONLY
Bacterial Vaginitis (gardnerella): NEGATIVE
Candida Glabrata: NEGATIVE
Candida Vaginitis: NEGATIVE
Chlamydia: NEGATIVE
Comment: NEGATIVE
Comment: NEGATIVE
Comment: NEGATIVE
Comment: NEGATIVE
Comment: NEGATIVE
Comment: NORMAL
Neisseria Gonorrhea: NEGATIVE
Trichomonas: NEGATIVE

## 2022-09-14 ENCOUNTER — Other Ambulatory Visit: Payer: Self-pay

## 2022-09-14 ENCOUNTER — Ambulatory Visit
Admission: RE | Admit: 2022-09-14 | Discharge: 2022-09-14 | Disposition: A | Discharge status: Home / Self Care | Payer: 59 | Source: Ambulatory Visit | Attending: Family Medicine | Admitting: Family Medicine

## 2022-09-14 VITALS — BP 138/89 | HR 80 | Temp 97.7°F | Resp 20

## 2022-09-14 DIAGNOSIS — J069 Acute upper respiratory infection, unspecified: Secondary | ICD-10-CM | POA: Diagnosis not present

## 2022-09-14 DIAGNOSIS — Z1152 Encounter for screening for COVID-19: Secondary | ICD-10-CM | POA: Diagnosis not present

## 2022-09-14 DIAGNOSIS — J4521 Mild intermittent asthma with (acute) exacerbation: Secondary | ICD-10-CM | POA: Insufficient documentation

## 2022-09-14 DIAGNOSIS — R432 Parageusia: Secondary | ICD-10-CM | POA: Diagnosis not present

## 2022-09-14 DIAGNOSIS — R059 Cough, unspecified: Secondary | ICD-10-CM | POA: Insufficient documentation

## 2022-09-14 DIAGNOSIS — B9789 Other viral agents as the cause of diseases classified elsewhere: Secondary | ICD-10-CM | POA: Diagnosis not present

## 2022-09-14 DIAGNOSIS — R439 Unspecified disturbances of smell and taste: Secondary | ICD-10-CM | POA: Diagnosis not present

## 2022-09-14 LAB — POCT INFLUENZA A/B
Influenza A, POC: NEGATIVE
Influenza B, POC: NEGATIVE

## 2022-09-14 MED ORDER — PREDNISONE 20 MG PO TABS: 40.0000 mg | ORAL_TABLET | Freq: Every day | ORAL | 0 refills | Status: DC

## 2022-09-14 MED ORDER — PROMETHAZINE-DM 6.25-15 MG/5ML PO SYRP: 5.0000 mL | ORAL_SOLUTION | Freq: Four times a day (QID) | ORAL | 0 refills | Status: DC | PRN

## 2022-09-14 NOTE — ED Triage Notes (Addendum)
Pt reports had "cold symptoms" sine Sunday but reports lost taste and smell on Tuesday. Pt inquiring about flu and covid test.

## 2022-09-14 NOTE — ED Provider Notes (Signed)
[facility]    CSN: [id number] Arrival date & time: 09/14/22  0920      History   Chief Complaint Chief Complaint  Patient presents with   Cough    Lost of taste and smell , breathing issues , aches and pains , fever and chills - Entered by patient    HPI Kathryn Harris is a 32 y.o. female.   Presenting today with 5-day history of nasal congestion, chest tightness, wheezing, sore throat, cough, and now loss of taste and smell.  Denies chest pain, shortness of breath, abdominal pain, nausea vomiting or diarrhea, fever.  So far trying Mucinex and Tylenol, albuterol inhalers with minimal relief.  History of asthma on albuterol as needed.    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   History reviewed. No pertinent surgical history.  OB History     Gravida  0   Para  0   Term  0   Preterm  0   AB  0   Living  0      SAB  0   IAB  0   Ectopic  0   Multiple  0   Live Births  0            Home Medications    Prior to Admission medications   Medication Sig Start Date End Date Taking? Authorizing Provider  predniSONE (DELTASONE) 20 MG tablet Take 2 tablets (40 mg total) by mouth daily with breakfast. 09/14/22  Yes Particia Nearing, PA-C  promethazine-dextromethorphan (PROMETHAZINE-DM) 6.25-15 MG/5ML syrup Take 5 mLs by mouth 4 (four) times daily as needed. 09/14/22  Yes Particia Nearing, PA-C  albuterol (PROVENTIL) (2.5 MG/3ML) 0.083% nebulizer solution Take 6 mLs (5 mg total) by nebulization every 6 (six) hours as needed for wheezing or shortness of breath. 01/11/20   Devoria Albe, MD  albuterol (VENTOLIN HFA) 108 (90 Base) MCG/ACT inhaler Inhale 2 puffs into the lungs every 4 (four) hours as needed. 01/11/20   Devoria Albe, MD  Multiple Vitamin (MULTIVITAMIN) tablet Take 1 tablet by mouth daily.    [provider]     Family History Family History  Problem Relation Age of Onset   Hypertension Father    Diabetes Other     Social History Social History   Tobacco Use   Smoking status: Never   Smokeless tobacco: Former    Quit date: 07/01/2007  Vaping Use   Vaping status: Never Used  Substance Use Topics   Alcohol use: Not Currently    Comment: occasionally   Drug use: Not Currently    Types: Marijuana     Allergies   Patient has no known allergies.   Review of Systems Review of Systems Per HPI  Physical Exam Triage Vital Signs ED Triage Vitals  Encounter Vitals Group     BP 09/14/22 1121 138/89     Systolic BP Percentile --      Diastolic BP Percentile --      Pulse Rate 09/14/22 1121 80     Resp 09/14/22 1121 20     Temp 09/14/22 1121 97.7 F (36.5 C)     Temp Source 09/14/22 1121 Oral     SpO2 09/14/22 1121 95 %     Weight --      Height --      Head Circumference --  Peak Flow --      Pain Score 09/14/22 1122 0     Pain Loc --      Pain Education --      Exclude from Growth Chart --    No data found.  Updated Vital Signs BP 138/89 (BP Location: Right Arm)   Pulse 80   Temp 97.7 F (36.5 C) (Oral)   Resp 20   LMP 08/22/2022   SpO2 95%   Visual Acuity Right Eye Distance:   Left Eye Distance:   Bilateral Distance:    Right Eye Near:   Left Eye Near:    Bilateral Near:     Physical Exam Vitals and nursing note reviewed.  Constitutional:      Appearance: Normal appearance.  HENT:     Head: Atraumatic.     Right Ear: Tympanic membrane and external ear normal.     Left Ear: Tympanic membrane and external ear normal.     Nose: Congestion present.     Mouth/Throat:     Mouth: Mucous membranes are moist.     Pharynx: Posterior oropharyngeal erythema present.  Eyes:     Extraocular Movements: Extraocular movements intact.     Conjunctiva/sclera: Conjunctivae normal.  Cardiovascular:     Rate and Rhythm: Normal rate and regular rhythm.      Heart sounds: Normal heart sounds.  Pulmonary:     Effort: Pulmonary effort is normal.     Breath sounds: Wheezing present. No rales.  Musculoskeletal:        General: Normal range of motion.     Cervical back: Normal range of motion and neck supple.  Skin:    General: Skin is warm and dry.  Neurological:     Mental Status: She is alert and oriented to person, place, and time.  Psychiatric:        Mood and Affect: Mood normal.        Thought Content: Thought content normal.      UC Treatments / Results  Labs (all labs ordered are listed, but only abnormal results are displayed) Labs Reviewed  SARS CORONAVIRUS 2 (TAT 6-24 HRS)  POCT INFLUENZA A/B    EKG   Radiology No results found.  Procedures Procedures (including critical care time)  Medications Ordered in UC Medications - No data to display  Initial Impression / Assessment and Plan / UC Course  I have reviewed the triage vital signs and the nursing notes.  Pertinent labs & imaging results that were available during my care of the patient were reviewed by me and considered in my medical decision making (see chart for details).     Vitals within normal limits today, rapid flu negative, COVID testing pending.  Good candidate for Paxlovid if positive for COVID.  Will treat for asthma exacerbation with prednisone, Phenergan DM, butyryl as needed.  Return for worsening symptoms.  Final Clinical Impressions(s) / UC Diagnoses   Final diagnoses:  Loss of taste  Viral URI with cough  Mild intermittent asthma with acute exacerbation   Discharge Instructions   None    ED Prescriptions     Medication Sig Dispense Auth. Provider   predniSONE (DELTASONE) 20 MG tablet Take 2 tablets (40 mg total) by mouth daily with breakfast. 10 tablet Particia Nearing, PA-C   promethazine-dextromethorphan (PROMETHAZINE-DM) 6.25-15 MG/5ML syrup Take 5 mLs by mouth 4 (four) times daily as needed. 100 mL Particia Nearing,  [place]      PDMP not reviewed  this encounter.   Particia Nearing, [place] 09/14/22 1215

## 2022-09-15 ENCOUNTER — Telehealth: Payer: Self-pay

## 2022-09-15 LAB — SARS CORONAVIRUS 2 (TAT 6-24 HRS): SARS Coronavirus 2: NEGATIVE

## 2022-09-15 MED ORDER — PROMETHAZINE-DM 6.25-15 MG/5ML PO SYRP: 5.0000 mL | ORAL_SOLUTION | Freq: Four times a day (QID) | ORAL | 0 refills | Status: DC | PRN
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
## 2022-11-21 DIAGNOSIS — Z91199 Patient's noncompliance with other medical treatment and regimen due to unspecified reason: Secondary | ICD-10-CM | POA: Diagnosis not present

## 2022-11-21 DIAGNOSIS — Z8249 Family history of ischemic heart disease and other diseases of the circulatory system: Secondary | ICD-10-CM | POA: Diagnosis not present

## 2022-11-21 DIAGNOSIS — Z833 Family history of diabetes mellitus: Secondary | ICD-10-CM | POA: Diagnosis not present

## 2022-11-21 DIAGNOSIS — J45909 Unspecified asthma, uncomplicated: Secondary | ICD-10-CM | POA: Diagnosis not present

## 2022-11-21 DIAGNOSIS — Z87891 Personal history of nicotine dependence: Secondary | ICD-10-CM | POA: Diagnosis not present

## 2022-11-21 DIAGNOSIS — Q049 Congenital malformation of brain, unspecified: Secondary | ICD-10-CM | POA: Diagnosis not present

## 2022-11-21 DIAGNOSIS — G4733 Obstructive sleep apnea (adult) (pediatric): Secondary | ICD-10-CM | POA: Diagnosis not present

## 2022-11-21 DIAGNOSIS — Z809 Family history of malignant neoplasm, unspecified: Secondary | ICD-10-CM | POA: Diagnosis not present

## 2022-11-21 DIAGNOSIS — Z6841 Body Mass Index (BMI) 40.0 and over, adult: Secondary | ICD-10-CM | POA: Diagnosis not present

## 2023-02-06 ENCOUNTER — Encounter: Payer: Self-pay | Admitting: Emergency Medicine

## 2023-02-06 ENCOUNTER — Ambulatory Visit
Admission: EM | Admit: 2023-02-06 | Discharge: 2023-02-06 | Disposition: A | Discharge status: Home / Self Care | Payer: 59 | Attending: Family Medicine | Admitting: Family Medicine

## 2023-02-06 ENCOUNTER — Other Ambulatory Visit: Payer: Self-pay

## 2023-02-06 DIAGNOSIS — J00 Acute nasopharyngitis [common cold]: Secondary | ICD-10-CM | POA: Diagnosis not present

## 2023-02-06 LAB — POC COVID19/FLU A&B COMBO
Covid Antigen, POC: NEGATIVE
Influenza A Antigen, POC: NEGATIVE
Influenza B Antigen, POC: NEGATIVE

## 2023-02-06 MED ORDER — PROMETHAZINE-DM 6.25-15 MG/5ML PO SYRP: 5.0000 mL | ORAL_SOLUTION | Freq: Four times a day (QID) | ORAL | 0 refills | Status: DC | PRN

## 2023-02-06 NOTE — ED Provider Notes (Signed)
 [facility]   [id number] 02/06/23 Arrival Time: 9146  ASSESSMENT & PLAN:  1. Common cold    Discussed typical duration of likely viral illness. Results for orders placed or performed during the hospital encounter of 02/06/23  POC Covid + Flu A/B Antigen   Collection Time: 02/06/23 11:23 AM  Result Value Ref Range   Influenza A Antigen, POC Negative Negative   Influenza B Antigen, POC Negative Negative   Covid Antigen, POC Negative Negative   OTC symptom care as needed.  Meds ordered this encounter  Medications   promethazine -dextromethorphan (PROMETHAZINE -DM) 6.25-15 MG/5ML syrup    Sig: Take 5 mLs by mouth 4 (four) times daily as needed for cough.    Dispense:  118 mL    Refill:  0  Work note provided.   Follow-up Information     [facility] at [facility].   Specialty: Urgent Care Why: If worsening or failing to improve as anticipated. Contact information: [address] [place]  [phone]                Reviewed expectations re: course of current medical issues. Questions answered. Outlined signs and symptoms indicating need for more acute intervention. Understanding verbalized. After Visit Summary given.   SUBJECTIVE: History from: Patient. Kathryn Harris is a 33 y.o. female. Pt reports nasal congestion, loss of taste and smell, body aches and chills since Saturday. Denies any known fevers.  Denies: difficulty breathing. Normal PO intake without n/v/d.  OBJECTIVE:  Vitals:   02/06/23 1058  BP: 117/84  Pulse: 91  Resp: 20  Temp: 98.3 F (36.8 C)  TempSrc: Oral  SpO2: 100%    General appearance: alert; no distress Eyes: PERRLA; EOMI; conjunctiva normal HENT: [place]; AT; with nasal congestion Neck: supple  Lungs: speaks full sentences without difficulty; unlabored; mild dry cough Extremities: no edema Skin: warm and dry Neurologic: normal gait Psychological: alert and  cooperative; normal mood and affect  Labs: Results for orders placed or performed during the hospital encounter of 02/06/23  POC Covid + Flu A/B Antigen   Collection Time: 02/06/23 11:23 AM  Result Value Ref Range   Influenza A Antigen, POC Negative Negative   Influenza B Antigen, POC Negative Negative   Covid Antigen, POC Negative Negative   Labs Reviewed  POC COVID19/FLU A&B COMBO    Imaging: No results found.  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   Social History   Socioeconomic History   Marital status: Significant Other    Spouse name: Not on file   Number of children: Not on file   Years of education: Not on file   Highest education level: Not on file  Occupational History   Not on file  Tobacco Use   Smoking status: Never   Smokeless tobacco: Former    Quit date: 07/01/2007  Vaping Use   Vaping status: Never Used  Substance and Sexual Activity   Alcohol use: Not Currently    Comment: occasionally   Drug use: Not Currently    Types: Marijuana   Sexual activity: Yes    Birth control/protection: None  Other Topics Concern   Not on file  Social History Narrative   Not on file   Social Drivers of Health   Financial Resource Strain: Low Risk  (09/06/2022)   Overall Financial Resource Strain (CARDIA)    Difficulty of Paying  Living Expenses: Not very hard  Food Insecurity: No Food Insecurity (09/06/2022)   Hunger Vital Sign    Worried About Running Out of Food in the Last Year: Never true    Ran Out of Food in the Last Year: Never true  Transportation Needs: No Transportation Needs (09/06/2022)   PRAPARE - Administrator, Civil Service (Medical): No    Lack of Transportation (Non-Medical): No  Physical Activity: Sufficiently Active (09/06/2022)   Exercise Vital Sign    Days of Exercise per Week: 5 days    Minutes of Exercise per Session: 40 min  Stress: No Stress Concern Present  (09/06/2022)   [employer] of Occupational Health - Occupational Stress Questionnaire    Feeling of Stress : Only a little  Social Connections: Moderately Isolated (09/06/2022)   Social Connection and Isolation Panel [NHANES]    Frequency of Communication with Friends and Family: Three times a week    Frequency of Social Gatherings with Friends and Family: Once a week    Attends Religious Services: Never    Database Administrator or Organizations: No    Attends Banker Meetings: Never    Marital Status: Living with partner  Intimate Partner Violence: Not At Risk (09/06/2022)   Humiliation, Afraid, Rape, and Kick questionnaire    Fear of Current or Ex-Partner: No    Emotionally Abused: No    Physically Abused: No    Sexually Abused: No   Family History  Problem Relation Age of Onset   Hypertension Father    Diabetes Other    History reviewed. No pertinent surgical history.   Rolinda Rogue, MD 02/06/23 1146

## 2023-02-06 NOTE — ED Triage Notes (Signed)
Pt reports nasal congestion, loss of taste and smell, body aches and chills since Saturday. Denies any known fevers.

## 2023-05-07 DIAGNOSIS — E66813 Obesity, class 3: Secondary | ICD-10-CM | POA: Diagnosis not present

## 2023-05-07 DIAGNOSIS — Z6841 Body Mass Index (BMI) 40.0 and over, adult: Secondary | ICD-10-CM | POA: Diagnosis not present

## 2023-05-08 ENCOUNTER — Emergency Department (HOSPITAL_COMMUNITY)
Admission: EM | Admit: 2023-05-08 | Discharge: 2023-05-08 | Disposition: A | Discharge status: Home / Self Care | Attending: Emergency Medicine | Admitting: Emergency Medicine

## 2023-05-08 ENCOUNTER — Encounter (HOSPITAL_COMMUNITY): Payer: Self-pay

## 2023-05-08 ENCOUNTER — Other Ambulatory Visit: Payer: Self-pay

## 2023-05-08 DIAGNOSIS — D649 Anemia, unspecified: Secondary | ICD-10-CM | POA: Diagnosis not present

## 2023-05-08 DIAGNOSIS — J45909 Unspecified asthma, uncomplicated: Secondary | ICD-10-CM | POA: Insufficient documentation

## 2023-05-08 HISTORY — DX: Benign intracranial hypertension: G93.2

## 2023-05-08 HISTORY — DX: Anemia, unspecified: D64.9

## 2023-05-08 LAB — CBC WITH DIFFERENTIAL/PLATELET
Abs Immature Granulocytes: 0.03 10*3/uL (ref 0.00–0.07)
Basophils Absolute: 0 10*3/uL (ref 0.0–0.1)
Basophils Relative: 0 %
Eosinophils Absolute: 0.2 10*3/uL (ref 0.0–0.5)
Eosinophils Relative: 3 %
HCT: 23.5 % — ABNORMAL LOW (ref 36.0–46.0)
Hemoglobin: 6 g/dL — CL (ref 12.0–15.0)
Immature Granulocytes: 0 %
Lymphocytes Relative: 19 %
Lymphs Abs: 1.3 10*3/uL (ref 0.7–4.0)
MCH: 14.3 pg — ABNORMAL LOW (ref 26.0–34.0)
MCHC: 25.5 g/dL — ABNORMAL LOW (ref 30.0–36.0)
MCV: 55.8 fL — ABNORMAL LOW (ref 80.0–100.0)
Monocytes Absolute: 0.4 10*3/uL (ref 0.1–1.0)
Monocytes Relative: 6 %
Neutro Abs: 4.8 10*3/uL (ref 1.7–7.7)
Neutrophils Relative %: 72 %
Platelets: 247 10*3/uL (ref 150–400)
RBC: 4.21 MIL/uL (ref 3.87–5.11)
RDW: 24.7 % — ABNORMAL HIGH (ref 11.5–15.5)
WBC: 6.8 10*3/uL (ref 4.0–10.5)
nRBC: 0 % (ref 0.0–0.2)

## 2023-05-08 LAB — ABO/RH: ABO/RH(D): O POS

## 2023-05-08 LAB — BASIC METABOLIC PANEL WITH GFR
Anion gap: 8 (ref 5–15)
BUN: 12 mg/dL (ref 6–20)
CO2: 21 mmol/L — ABNORMAL LOW (ref 22–32)
Calcium: 8.4 mg/dL — ABNORMAL LOW (ref 8.9–10.3)
Chloride: 106 mmol/L (ref 98–111)
Creatinine, Ser: 0.76 mg/dL (ref 0.44–1.00)
GFR, Estimated: >60 mL/min (ref >60)
Glucose, Bld: 93 mg/dL (ref 70–99)
Potassium: 3.5 mmol/L (ref 3.5–5.1)
Sodium: 135 mmol/L (ref 135–145)

## 2023-05-08 LAB — PREPARE RBC (CROSSMATCH)

## 2023-05-08 LAB — HCG, SERUM, QUALITATIVE: Preg, Serum: NEGATIVE

## 2023-05-08 MED ORDER — SODIUM CHLORIDE 0.9% IV SOLUTION: Freq: Once | INTRAVENOUS | Status: DC

## 2023-05-08 NOTE — ED Triage Notes (Signed)
 Pt arrived via POV c/o low Hgb of 6.3. Pt reports receiving phone call from PCP and was advised to come to the ER for a blood transfusion. Pt endorses left chest pain and SOB.

## 2023-05-08 NOTE — ED Provider Notes (Signed)
 Here for acute on chronic anemia. Identified on outpatient labwork. Recent menorrhagia, improving, without other known sources of blood loss. 2 units ordered. Plan for DC after transfusion.  Physical Exam  BP 119/74   Pulse 89   Temp 98.5 F (36.9 C) (Oral)   Resp 18   Ht 5\' 5"  (1.651 m)   Wt (!) 143 kg   SpO2 100%   BMI 52.46 kg/m   Physical Exam Vitals and nursing note reviewed.  Constitutional:      General: She is not in acute distress.    Appearance: Normal appearance. She is well-developed. She is not ill-appearing, toxic-appearing or diaphoretic.  HENT:     Head: Normocephalic and atraumatic.     Right Ear: External ear normal.     Left Ear: External ear normal.     Nose: Nose normal.     Mouth/Throat:     Mouth: Mucous membranes are moist.  Eyes:     Extraocular Movements: Extraocular movements intact.     Conjunctiva/sclera: Conjunctivae normal.  Cardiovascular:     Rate and Rhythm: Normal rate and regular rhythm.  Pulmonary:     Effort: Pulmonary effort is normal. No respiratory distress.  Abdominal:     General: Abdomen is flat. There is no distension.  Musculoskeletal:        General: No swelling. Normal range of motion.     Cervical back: Normal range of motion and neck supple.  Skin:    General: Skin is warm and dry.     Coloration: Skin is not jaundiced or pale.  Neurological:     General: No focal deficit present.     Mental Status: She is alert and oriented to person, place, and time.  Psychiatric:        Mood and Affect: Mood normal.        Behavior: Behavior normal.     Procedures  Procedures  ED Course / MDM   Clinical Course as of 05/08/23 1526  Tue May 08, 2023  1514 Patient with likely acute on chronic anemia.  CBC pending at this time Signout to Dr. Kermit Ped with labs pending and likely transfusion [DW]    Clinical Course User Index [DW] Eldon Greenland, MD   Medical Decision Making Amount and/or Complexity of Data Reviewed Labs:  ordered. ECG/medicine tests: ordered.  Risk Prescription drug management.   Onset, patient resting comfortably.  She is tolerating the transfusion well.  Patient continued to tolerate transfusion.  Following transfusion, patient discharged in stable condition.       Iva Mariner, MD 05/08/23 2040

## 2023-05-08 NOTE — ED Provider Notes (Signed)
 [facility] EMERGENCY DEPARTMENT AT [facility] Provider Note   CSN: [id number] Arrival date & time: 05/08/23  1349     History  Chief Complaint  Patient presents with   Anemia    Kathryn Harris is a 33 y.o. female.  The history is provided by the patient.  Patient with history of chronic anemia, asthma, obesity, IIH presents with abnormal labs.  Patient was seen at [facility] at the bariatric center yesterday.  She had labs performed, was called and told that her blood counts were low and she required a blood transfusion.  Patient reports long history of anemia and is supposed to be taking iron but does not do it consistently.  She reports recent heavy menstrual cycle which she is currently on.  Denies any bloody or dark stools.  No vomiting.  She has felt fatigued and lightheaded at work, and she did have some chest tightness but that has since resolved.  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 Authorizing Provider  albuterol  (PROVENTIL ) (2.5 MG/3ML) 0.083% nebulizer solution Take 6 mLs (5 mg total) by nebulization every 6 (six) hours as needed for wheezing or shortness of breath. 01/11/20   Knapp, Iva, MD  albuterol  (VENTOLIN  HFA) [phone] Base) MCG/ACT inhaler Inhale 2 puffs into the lungs every 4 (four) hours as needed. 01/11/20   Knapp, Iva, MD  Multiple Vitamin (MULTIVITAMIN) tablet Take 1 tablet by mouth daily.    [provider]      Allergies    Patient has no known allergies.    Review of Systems   Review of Systems  Gastrointestinal:  Negative for blood in stool.  Genitourinary:  Positive for vaginal bleeding.  Neurological:  Positive for light-headedness.     Physical Exam Updated Vital Signs BP 119/74   Pulse 89   Temp 98.5 F (36.9 C) (Oral)   Resp 18   Ht 1.651 m (5\' 5" )   Wt (!) 143 kg   SpO2 100%   BMI 52.46 kg/m  Physical Exam CONSTITUTIONAL: Well developed/well nourished, no distress using her phone HEAD: Normocephalic/atraumatic EYES: EOMI/PERRL, conjunctiva pink ENMT: Mucous membranes moist NECK: supple no meningeal signs CV: S1/S2 noted, no murmurs/rubs/gallops noted LUNGS: Lungs are clear to auscultation bilaterally, no apparent distress ABDOMEN: soft, nontender, obese GU:no cva tenderness NEURO: Pt is awake/alert/appropriate, moves all extremitiesx4.  No facial droop.   SKIN: warm, color normal PSYCH: no abnormalities of mood noted, alert and oriented to situation  ED Results / Procedures / Treatments   Labs (all labs ordered are listed, but only abnormal results are displayed) Labs Reviewed  BASIC METABOLIC PANEL WITH GFR - Abnormal; Notable for the following components:      Result Value   CO2 21 (*)    Calcium 8.4 (*)    All other components within normal limits  CBC WITH DIFFERENTIAL/PLATELET  HCG, SERUM, QUALITATIVE  TYPE AND SCREEN    EKG None  Radiology No results found.  Procedures Procedures    Medications Ordered in ED Medications - No data to display  ED Course/ Medical Decision Making/ A&P Clinical Course as of 05/08/23 1514  Tue May 08, 2023  1514  Patient with likely acute on chronic anemia.  CBC pending at this time Signout to Dr. Kermit Ped with labs pending and likely transfusion [DW]    Clinical Course User Index [DW] Eldon Greenland, MD                                 Medical Decision Making Amount and/or Complexity of Data Reviewed Labs: ordered. ECG/medicine tests: ordered.           Final Clinical Impression(s) / ED Diagnoses Final diagnoses:  None    Rx / DC Orders ED Discharge Orders     None         Eldon Greenland, MD 05/08/23 1514

## 2023-05-08 NOTE — ED Notes (Signed)
 Nurse offered a sandwhich and pt  refused.

## 2023-05-08 NOTE — ED Notes (Signed)
 Crackers and ice given as pt request and EDP approval.

## 2023-05-08 NOTE — Discharge Instructions (Addendum)
 Continue to follow-up with your outpatient providers.  You should follow-up with your OB/GYN as well.  There are medications they may want to start to minimize blood loss from menstrual periods.  You would also likely benefit from an iron supplement.  Talk to your primary care doctor about this.

## 2023-05-09 LAB — BPAM RBC
Blood Product Expiration Date: 202505252359
Blood Product Expiration Date: 202505262359
ISSUE DATE / TIME: 202505061606
ISSUE DATE / TIME: 202505061930
Unit Type and Rh: 5100
Unit Type and Rh: 5100

## 2023-05-09 LAB — TYPE AND SCREEN
ABO/RH(D): O POS
Antibody Screen: NEGATIVE
Unit division: 0
Unit division: 0

## 2023-05-10 ENCOUNTER — Encounter: Payer: Self-pay | Admitting: Obstetrics & Gynecology

## 2023-05-10 ENCOUNTER — Other Ambulatory Visit (HOSPITAL_COMMUNITY)
Admission: RE | Admit: 2023-05-10 | Discharge: 2023-05-10 | Disposition: A | Discharge status: Home / Self Care | Source: Ambulatory Visit | Attending: Obstetrics & Gynecology | Admitting: Obstetrics & Gynecology

## 2023-05-10 ENCOUNTER — Ambulatory Visit: Admitting: Obstetrics & Gynecology

## 2023-05-10 VITALS — BP 103/72 | HR 90 | Ht 65.0 in | Wt 312.4 lb

## 2023-05-10 DIAGNOSIS — D508 Other iron deficiency anemias: Secondary | ICD-10-CM

## 2023-05-10 DIAGNOSIS — D500 Iron deficiency anemia secondary to blood loss (chronic): Secondary | ICD-10-CM | POA: Diagnosis not present

## 2023-05-10 DIAGNOSIS — Z1151 Encounter for screening for human papillomavirus (HPV): Secondary | ICD-10-CM

## 2023-05-10 NOTE — Progress Notes (Signed)
 Follow up appointment for results:   Chief Complaint  Patient presents with   Follow-up    Post blood transfusion    Blood pressure 103/72, pulse 90, height 5\' 5"  (1.651 m), weight (!) 312 lb 6.4 oz (141.7 kg), last menstrual period 05/04/2023.      Latest Ref Rng & Units 05/08/2023    2:32 PM 06/20/2018   12:08 PM 11/08/2017    8:45 PM  CBC  WBC 4.0 - 10.5 K/uL 6.8  7.0  6.7   Hemoglobin 12.0 - 15.0 g/dL 6.0  91.4  8.2   Hematocrit 36.0 - 46.0 % 23.5  40.8  30.0   Platelets 150 - 400 K/uL 247  308  261    Patient is dramatically low hemoglobin and was referred to see him in the because of heavy periods However upon questioning there appears you really are not very heavy I looked back at her CBCs and iron  studies over the years She has markedly low MCV's even with relatively normal hemoglobins I am going to order a sonogram I am also referring her to hematology for evaluation and management I will see her back in a couple weeks for follow-up  General WDWN female NAD Vulva:  normal appearing vulva with no masses, tenderness or lesions Vagina:  normal mucosa, no discharge Cervix:  Normal no lesions Pap done Uterus:  normal size, contour, position, consistency, mobility, non-tender Adnexa: ovaries:present,  normal adnexa in size, nontender and no masses   MEDS ordered this encounter: No orders of the defined types were placed in this encounter.   Orders for this encounter: Orders Placed This Encounter  Procedures   US  PELVIS (TRANSABDOMINAL ONLY)   US  PELVIS TRANSVAGINAL NON-OB (TV ONLY)    Impression + Management Plan   ICD-10-CM   1. Screening for HPV (human papillomavirus)  Z11.51 Cytology - PAP    2. 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 Other iron  deficiency anemia  D50.8 US  PELVIS (TRANSABDOMINAL ONLY)    US  PELVIS TRANSVAGINAL NON-OB (TV ONLY)      Follow Up: No follow-ups on file.     All questions were answered.  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    Past Surgical History:  Procedure Laterality Date   EYE SURGERY      OB History     Gravida  0   Para  0   Term  0   Preterm  0   AB  0   Living  0      SAB  0   IAB  0   Ectopic  0   Multiple  0   Live Births  0           No Known Allergies  Social History   Socioeconomic History   Marital status: Significant Other    Spouse name: Not on file   Number of children: Not on file   Years of education: Not on file   Highest education level: Not on file  Occupational History   Not on file  Tobacco Use   Smoking status: Never   Smokeless tobacco: Former    Quit date: 07/01/2007  Vaping Use   Vaping status: Never Used  Substance and Sexual Activity   Alcohol use: Not Currently    Comment: occasionally   Drug use: Not  Currently    Types: Marijuana   Sexual activity: Yes    Birth control/protection: None  Other Topics Concern   Not on file  Social History Narrative   Not on file   Social Drivers of Health   Financial Resource Strain: Low Risk  (09/06/2022)   Overall Financial Resource Strain (CARDIA)    Difficulty of Paying Living Expenses: Not very hard  Food Insecurity: No Food Insecurity (09/06/2022)   Hunger Vital Sign    Worried About Running Out of Food in the Last Year: Never true    Ran Out of Food in the Last Year: Never true  Transportation Needs: No Transportation Needs (09/06/2022)   PRAPARE - Administrator, Civil Service (Medical): No    Lack of Transportation (Non-Medical): No  Physical Activity: Sufficiently Active (09/06/2022)   Exercise Vital Sign    Days of Exercise per Week: 5 days    Minutes of Exercise per Session: 40 min  Stress: No Stress Concern Present (09/06/2022)   [employer] of Occupational Health - Occupational Stress Questionnaire    Feeling of Stress : Only a little  Social  Connections: Moderately Isolated (09/06/2022)   Social Connection and Isolation Panel [NHANES]    Frequency of Communication with Friends and Family: Three times a week    Frequency of Social Gatherings with Friends and Family: Once a week    Attends Religious Services: Never    Database administrator or Organizations: No    Attends Banker Meetings: Never    Marital Status: Living with partner    Family History  Problem Relation Age of Onset   Hypertension Father    Diabetes Other

## 2023-05-14 LAB — CYTOLOGY - PAP
Adequacy: ABSENT
Comment: NEGATIVE
Diagnosis: NEGATIVE
High risk HPV: NEGATIVE

## 2023-05-15 DIAGNOSIS — R718 Other abnormality of red blood cells: Secondary | ICD-10-CM | POA: Insufficient documentation

## 2023-05-15 DIAGNOSIS — D500 Iron deficiency anemia secondary to blood loss (chronic): Secondary | ICD-10-CM | POA: Insufficient documentation

## 2023-05-16 ENCOUNTER — Inpatient Hospital Stay: Admitting: Licensed Clinical Social Worker

## 2023-05-16 ENCOUNTER — Inpatient Hospital Stay: Attending: Hematology | Admitting: Hematology

## 2023-05-16 ENCOUNTER — Inpatient Hospital Stay

## 2023-05-16 ENCOUNTER — Encounter: Payer: Self-pay | Admitting: *Deleted

## 2023-05-16 VITALS — BP 132/79 | HR 71 | Temp 98.6°F | Resp 18 | Wt 312.4 lb

## 2023-05-16 DIAGNOSIS — R718 Other abnormality of red blood cells: Secondary | ICD-10-CM

## 2023-05-16 DIAGNOSIS — Z800 Family history of malignant neoplasm of digestive organs: Secondary | ICD-10-CM | POA: Diagnosis not present

## 2023-05-16 DIAGNOSIS — D500 Iron deficiency anemia secondary to blood loss (chronic): Secondary | ICD-10-CM

## 2023-05-16 DIAGNOSIS — Z832 Family history of diseases of the blood and blood-forming organs and certain disorders involving the immune mechanism: Secondary | ICD-10-CM | POA: Diagnosis not present

## 2023-05-16 DIAGNOSIS — D509 Iron deficiency anemia, unspecified: Secondary | ICD-10-CM | POA: Insufficient documentation

## 2023-05-16 NOTE — Progress Notes (Signed)
 [facility] CSW Progress Note  Visual merchandiser received a referral for pt due to depression being identified on the SDOH screen.  Pt reports she does not have a history of depression and has never sought counseling or taken medication due to depression.  Pt states at present she is experiencing frustration and has been feeling down due to her medical issues.  Pt has needed to go to a number of doctor's appointments and is also concerned about losing her job if they are not able to continue to accommodate her absences.  Pt reports she does have good support from family and friends.  CSW provided pt w/ contact information and informed of supportive counseling available as well as possible referral or recommendations for outside counseling if it is at any point felt to be appropriate.      Kathryn Bruns, LCSW Clinical Social Worker [facility]

## 2023-05-16 NOTE — Patient Instructions (Addendum)
 You were seen and examined today by Dr. Cheree Cords. Dr. Cheree Cords is a hematologist, meaning that he specializes in blood abnormalities. Dr. Cheree Cords discussed your past medical history, family history of cancers/blood conditions and the events that led to you being here today.  You were referred to Dr. Cheree Cords due to iron deficiency anemia. He has recommended you receive and IV iron infusion. This will be scheduled here and given in our clinic.   Follow-up as scheduled.

## 2023-05-16 NOTE — Progress Notes (Signed)
 [facility] [address], [place] [address]   Clinic Day:  05/16/2023  Referring physician: Wendelyn Halter, MD  Patient Care Team: Pcp, No as PCP - General   ASSESSMENT & PLAN:   Assessment:  1.  Severe iron deficiency anemia: - Patient seen at the request of Dr. Farrel Hones. - 05/08/2023: Hb-6, MCV-55.8.  She received 2 units PRBC.  She has been anemic since 2012.  MCV is also low at 60.6 even with hemoglobin 10.6.  MCV was 72 and hemoglobin was 12.1. - She has occasional heavy menses, regular once a month.  Denies BRBPR/melena.  Positive for ice pica. - Started taking iron tablet daily on 05/09/2023 and is tolerating well.  2. Social/family history: - She works in a [employer] and does physical work.  Non-smoker. - Sister has anemia.  No family history of sickle cell anemia or thalassemia. - Maternal grandmother had pancreatic cancer.  Maternal grandfather had lung cancer.   Plan:  1.  Severe iron deficiency anemia: - I reviewed her labs from 05/08/2023. - We discussed parenteral iron therapy with INFeD 1 g IV x 1.  We discussed rare chance of anaphylactic reactions. - We will schedule her for INFeD.  RTC 8 weeks with CBC, ferritin, iron panel, B12 and folic acid levels.   No orders of the defined types were placed in this encounter.     Nadeen Augusta Teague,acting as a Neurosurgeon for Paulett Boros, MD.,have documented all relevant documentation on the behalf of Paulett Boros, MD,as directed by  Paulett Boros, MD while in the presence of Paulett Boros, MD.   I, Paulett Boros MD, have reviewed the above documentation for accuracy and completeness, and I agree with the above.   Paulett Boros, MD   5/14/202510:04 AM  CHIEF COMPLAINT/PURPOSE OF CONSULT:   Diagnosis: Severe iron deficiency anemia  Current Therapy: Parenteral iron therapy  HISTORY OF PRESENT ILLNESS:   Kathryn Harris is a 33 y.o. female presenting to clinic today  for evaluation of anemia at the request of Dr. Farrel Hones.  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   Lisaanne was seen by the bariatric center at [facility] on 05/07/23 and had lab work done the same day. She presented to the ED the following day due to HGB being severely low at 6.3, with recent menorrhagia noted. She was transfused 2 units of PRBCs and discharged.   Her most recent CBC was from ED visit on 05/08/23 with low HGB at 6.0, low HCT at 23.5, low MCV at 55.8, low MCH at 14.3, low MCHC at 25.5, and elevated RDW at 24.7. Iron and TIBC panel from 05/07/23 was abnormal with low iron at 17, elevated TIBC at 508, and low saturation at 3.   Today, she states that she is doing well overall. Her appetite level is at 75%. Her energy level is at 50%.  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    Surgical History: Past Surgical History:  Procedure Laterality Date   EYE SURGERY      Social History: Social History   Socioeconomic History   Marital status: Significant Other    Spouse name: Not on file   Number of children: Not on file  Years of education: Not on file   Highest education level: Not on file  Occupational History   Not on file  Tobacco Use   Smoking status: Never   Smokeless tobacco: Former    Quit date: 07/01/2007  Vaping Use   Vaping status: Never Used  Substance and Sexual Activity   Alcohol use: Not Currently    Comment: occasionally   Drug use: Not Currently    Types: Marijuana   Sexual activity: Yes    Birth control/protection: None  Other Topics Concern   Not on file  Social History Narrative   Not on file   Social Drivers of Health   Financial Resource Strain: Low Risk  (09/06/2022)    Overall Financial Resource Strain (CARDIA)    Difficulty of Paying Living Expenses: Not very hard  Food Insecurity: No Food Insecurity (09/06/2022)   Hunger Vital Sign    Worried About Running Out of Food in the Last Year: Never true    Ran Out of Food in the Last Year: Never true  Transportation Needs: No Transportation Needs (09/06/2022)   PRAPARE - Administrator, Civil Service (Medical): No    Lack of Transportation (Non-Medical): No  Physical Activity: Sufficiently Active (09/06/2022)   Exercise Vital Sign    Days of Exercise per Week: 5 days    Minutes of Exercise per Session: 40 min  Stress: No Stress Concern Present (09/06/2022)   [employer] of Occupational Health - Occupational Stress Questionnaire    Feeling of Stress : Only a little  Social Connections: Moderately Isolated (09/06/2022)   Social Connection and Isolation Panel [NHANES]    Frequency of Communication with Friends and Family: Three times a week    Frequency of Social Gatherings with Friends and Family: Once a week    Attends Religious Services: Never    Database administrator or Organizations: No    Attends Banker Meetings: Never    Marital Status: Living with partner  Intimate Partner Violence: Not At Risk (09/06/2022)   Humiliation, Afraid, Rape, and Kick questionnaire    Fear of Current or Ex-Partner: No    Emotionally Abused: No    Physically Abused: No    Sexually Abused: No    Family History: Family History  Problem Relation Age of Onset   Hypertension Father    Diabetes Other     Current Medications:  Current Outpatient Medications:    albuterol  (VENTOLIN  HFA) 108 (90 Base) MCG/ACT inhaler, Inhale 2 puffs into the lungs every 4 (four) hours as needed., Disp: 6.7 g, Rfl: 0   Allergies: No Known Allergies  REVIEW OF SYSTEMS:   Review of Systems  Constitutional:  Negative for chills, fatigue and fever.  HENT:   Negative for lump/mass, mouth sores, nosebleeds, sore  throat and trouble swallowing.   Eyes:  Negative for eye problems.  Respiratory:  Positive for shortness of breath (On exertion). Negative for cough.   Cardiovascular:  Negative for chest pain, leg swelling and palpitations.  Gastrointestinal:  Negative for abdominal pain, constipation, diarrhea, nausea and vomiting.  Genitourinary:  Negative for bladder incontinence, difficulty urinating, dysuria, frequency, hematuria and nocturia.   Musculoskeletal:  Negative for arthralgias, back pain, flank pain, myalgias and neck pain.  Skin:  Negative for itching and rash.  Neurological:  Positive for headaches. Negative for dizziness and numbness.  Hematological:  Does not bruise/bleed easily.  Psychiatric/Behavioral:  Positive for depression. Negative for sleep disturbance and suicidal ideas. The  patient is nervous/anxious.   All other systems reviewed and are negative.    VITALS:   Blood pressure 132/79, pulse 71, temperature 98.6 F (37 C), temperature source Oral, resp. rate 18, weight (!) 312 lb 6.3 oz (141.7 kg), last menstrual period 05/04/2023, SpO2 100%.  Wt Readings from Last 3 Encounters:  05/16/23 (!) 312 lb 6.3 oz (141.7 kg)  05/10/23 (!) 312 lb 6.4 oz (141.7 kg)  05/08/23 (!) 315 lb 4.1 oz (143 kg)    Body mass index is 51.98 kg/m.   PHYSICAL EXAM:   Physical Exam Vitals and nursing note reviewed. Exam conducted with a chaperone present.  Constitutional:      Appearance: Normal appearance.  Cardiovascular:     Rate and Rhythm: Normal rate and regular rhythm.     Pulses: Normal pulses.     Heart sounds: Normal heart sounds.  Pulmonary:     Effort: Pulmonary effort is normal.     Breath sounds: Normal breath sounds.  Abdominal:     Palpations: Abdomen is soft. There is no hepatomegaly, splenomegaly or mass.     Tenderness: There is no abdominal tenderness.  Musculoskeletal:     Right lower leg: No edema.     Left lower leg: No edema.  Lymphadenopathy:     Cervical: No  cervical adenopathy.     Right cervical: No superficial, deep or posterior cervical adenopathy.    Left cervical: No superficial, deep or posterior cervical adenopathy.     Upper Body:     Right upper body: No supraclavicular or axillary adenopathy.     Left upper body: No supraclavicular or axillary adenopathy.  Neurological:     General: No focal deficit present.     Mental Status: She is alert and oriented to person, place, and time.  Psychiatric:        Mood and Affect: Mood normal.        Behavior: Behavior normal.     LABS:   CBC    Component Value Date/Time   WBC 6.8 05/08/2023 1432   RBC 4.21 05/08/2023 1432   HGB 6.0 (LL) 05/08/2023 1432   HCT 23.5 (L) 05/08/2023 1432   PLT 247 05/08/2023 1432   MCV 55.8 (L) 05/08/2023 1432   MCH 14.3 (L) 05/08/2023 1432   MCHC 25.5 (L) 05/08/2023 1432   RDW 24.7 (H) 05/08/2023 1432   LYMPHSABS 1.3 05/08/2023 1432   MONOABS 0.4 05/08/2023 1432   EOSABS 0.2 05/08/2023 1432   BASOSABS 0.0 05/08/2023 1432    CMP    Component Value Date/Time   NA 135 05/08/2023 1432   K 3.5 05/08/2023 1432   CL 106 05/08/2023 1432   CO2 21 (L) 05/08/2023 1432   GLUCOSE 93 05/08/2023 1432   BUN 12 05/08/2023 1432   CREATININE 0.76 05/08/2023 1432   CALCIUM 8.4 (L) 05/08/2023 1432   PROT 7.3 10/09/2015 0255   ALBUMIN 3.5 10/09/2015 0255   AST 14 (L) 10/09/2015 0255   ALT 12 (L) 10/09/2015 0255   ALKPHOS 70 10/09/2015 0255   BILITOT 0.1 (L) 10/09/2015 0255   GFRNONAA >60 05/08/2023 1432   GFRAA >60 06/20/2018 1208    No results found for: "CEA1", "CEA" / No results found for: "CEA1", "CEA" No results found for: "PSA1" No results found for: "WNU272" No results found for: "CAN125"  No results found for: "TOTALPROTELP", "ALBUMINELP", "A1GS", "A2GS", "BETS", "BETA2SER", "GAMS", "MSPIKE", "SPEI" No results found for: "TIBC", "FERRITIN", "IRONPCTSAT" No results found for: "LDH"  STUDIES:   No results found.

## 2023-05-17 MED FILL — Iron Dextran Inj 50 MG/ML (Elemental Iron): INTRAMUSCULAR | Qty: 19 | Status: AC

## 2023-05-18 ENCOUNTER — Inpatient Hospital Stay

## 2023-05-18 ENCOUNTER — Ambulatory Visit: Admitting: Obstetrics & Gynecology

## 2023-05-18 VITALS — BP 134/73 | HR 65 | Temp 98.0°F | Resp 18

## 2023-05-18 DIAGNOSIS — D509 Iron deficiency anemia, unspecified: Secondary | ICD-10-CM | POA: Diagnosis not present

## 2023-05-18 DIAGNOSIS — D500 Iron deficiency anemia secondary to blood loss (chronic): Secondary | ICD-10-CM

## 2023-05-18 MED ORDER — SODIUM CHLORIDE 0.9 % IV SOLN
50.0000 mg | Freq: Once | INTRAVENOUS | Status: AC
  Administered 2023-05-18: 50 mg via INTRAVENOUS
  Filled 2023-05-18: qty 1

## 2023-05-18 MED ORDER — ACETAMINOPHEN 325 MG PO TABS
650.0000 mg | ORAL_TABLET | Freq: Once | ORAL | Status: AC
  Administered 2023-05-18: 650 mg via ORAL
  Filled 2023-05-18: qty 2

## 2023-05-18 MED ORDER — SODIUM CHLORIDE 0.9 % IV SOLN
INTRAVENOUS | Status: DC
Start: 2023-05-18 — End: 2023-05-18

## 2023-05-18 MED ORDER — FAMOTIDINE IN NACL 20-0.9 MG/50ML-% IV SOLN
20.0000 mg | Freq: Once | INTRAVENOUS | Status: AC
Start: 2023-05-18 — End: 2023-05-18
  Administered 2023-05-18: 20 mg via INTRAVENOUS
  Filled 2023-05-18: qty 50

## 2023-05-18 MED ORDER — METHYLPREDNISOLONE SODIUM SUCC 125 MG IJ SOLR
125.0000 mg | Freq: Once | INTRAMUSCULAR | Status: AC
  Administered 2023-05-18: 125 mg via INTRAVENOUS
  Filled 2023-05-18: qty 2

## 2023-05-18 MED ORDER — CETIRIZINE HCL 10 MG/ML IV SOLN
10.0000 mg | Freq: Once | INTRAVENOUS | Status: AC
  Administered 2023-05-18: 10 mg via INTRAVENOUS
  Filled 2023-05-18: qty 1

## 2023-05-18 MED ORDER — DIPHENHYDRAMINE HCL 25 MG PO CAPS
50.0000 mg | ORAL_CAPSULE | Freq: Once | ORAL | Status: DC
Start: 2023-05-18 — End: 2023-05-18

## 2023-05-18 MED ORDER — SODIUM CHLORIDE 0.9 % IV SOLN
950.0000 mg | Freq: Once | INTRAVENOUS | Status: AC
  Administered 2023-05-18: 950 mg via INTRAVENOUS
  Filled 2023-05-18: qty 19

## 2023-05-18 NOTE — Patient Instructions (Signed)
 [facility] - A DEPT OF [facility]  Discharge Instructions: Thank you for choosing [facility] to provide your oncology and hematology care.  If you have a lab appointment with the Cancer Center - please note that after April 8th, 2024, all labs will be drawn in the cancer center.  You do not have to check in or register with the main entrance as you have in the past but will complete your check-in in the cancer center.  Wear comfortable clothing and clothing appropriate for easy access to any Portacath or PICC line.   We strive to give you quality time with your provider. You may need to reschedule your appointment if you arrive late (15 or more minutes).  Arriving late affects you and other patients whose appointments are after yours.  Also, if you miss three or more appointments without notifying the office, you may be dismissed from the clinic at the provider's discretion.      For prescription refill requests, have your pharmacy contact our office and allow 72 hours for refills to be completed.    Today you received Infed IV iron infusion.    BELOW ARE SYMPTOMS THAT SHOULD BE REPORTED IMMEDIATELY: *FEVER GREATER THAN 100.4 F (38 C) OR HIGHER *CHILLS OR SWEATING *NAUSEA AND VOMITING THAT IS NOT CONTROLLED WITH YOUR NAUSEA MEDICATION *UNUSUAL SHORTNESS OF BREATH *UNUSUAL BRUISING OR BLEEDING *URINARY PROBLEMS (pain or burning when urinating, or frequent urination) *BOWEL PROBLEMS (unusual diarrhea, constipation, pain near the anus) TENDERNESS IN MOUTH AND THROAT WITH OR WITHOUT PRESENCE OF ULCERS (sore throat, sores in mouth, or a toothache) UNUSUAL RASH, SWELLING OR PAIN  UNUSUAL VAGINAL DISCHARGE OR ITCHING   Items with * indicate a potential emergency and should be followed up as soon as possible or go to the Emergency Department if any problems should occur.  Please show the CHEMOTHERAPY ALERT CARD or IMMUNOTHERAPY ALERT CARD at  check-in to the Emergency Department and triage nurse.  Should you have questions after your visit or need to cancel or reschedule your appointment, please contact [facility] [place] - A DEPT OF [facility] [phone]  and follow the prompts.  Office hours are 8:00 a.m. to 4:30 p.m. Monday - Friday. Please note that voicemails left after 4:00 p.m. may not be returned until the following business day.  We are closed weekends and major holidays. You have access to a nurse at all times for urgent questions. Please call the main number to the clinic [phone] and follow the prompts.  For any non-urgent questions, you may also contact your provider using MyChart. We now offer e-Visits for anyone 42 and older to request care online for non-urgent symptoms. For details visit [email or web address].   Also download the MyChart app! Go to the app store, search "MyChart", open the app, select [facility], and log in with your MyChart username and password.

## 2023-05-18 NOTE — Progress Notes (Signed)
Patient presents today for iron infusion. Patient is in satisfactory condition with no new complaints voiced.  Vital signs are stable.  We will proceed with infusion per provider orders.    Peripheral IV started with good blood return pre and post infusion.  Infed 1,000 mg given today per MD orders. Tolerated infusion without adverse affects. Vital signs stable. No complaints at this time. Discharged from clinic ambulatory in stable condition. Alert and oriented x 3. F/U with [facility] as scheduled.

## 2023-05-29 ENCOUNTER — Other Ambulatory Visit: Payer: Self-pay | Admitting: Obstetrics & Gynecology

## 2023-05-29 DIAGNOSIS — D508 Other iron deficiency anemias: Secondary | ICD-10-CM

## 2023-05-30 ENCOUNTER — Encounter: Payer: Self-pay | Admitting: Hematology

## 2023-05-31 ENCOUNTER — Encounter: Payer: Self-pay | Admitting: Obstetrics & Gynecology

## 2023-05-31 ENCOUNTER — Ambulatory Visit (INDEPENDENT_AMBULATORY_CARE_PROVIDER_SITE_OTHER): Admitting: Radiology

## 2023-05-31 ENCOUNTER — Ambulatory Visit: Admitting: Obstetrics & Gynecology

## 2023-05-31 VITALS — BP 118/80 | HR 56 | Ht 65.0 in | Wt 313.0 lb

## 2023-05-31 DIAGNOSIS — D508 Other iron deficiency anemias: Secondary | ICD-10-CM | POA: Diagnosis not present

## 2023-05-31 MED ORDER — MEGESTROL ACETATE 40 MG PO TABS: 40.0000 mg | ORAL_TABLET | Freq: Every day | ORAL | 11 refills | Status: DC

## 2023-05-31 NOTE — Progress Notes (Addendum)
  GYN US : TA and TV imaging performed - vinyl probe cover used - Chaperone: Keila Retroverted uterus normal in size, symmetrical, homogeneous myometrium, no focal abn seen Endom thickness = 18.6 mm, (Day 1 of cycle) uniform avascular cavity and canal, no evidence of soft tissue intracavitary defects Nl ov's, Right ovary appears mobile, Left ovary does not appear mobile, no visible evidence of PCOS - neg adnexal regions, neg CDS, small amount of clear free fluid present within CDS

## 2023-05-31 NOTE — Progress Notes (Signed)
 Follow up appointment for results: sonogram  No chief complaint on file.   Blood pressure 118/80, pulse (!) 56, height 5\' 5"  (1.651 m), weight (!) 313 lb (142 kg), last menstrual period 05/04/2023.  US  PELVIC COMPLETE WITH TRANSVAGINAL Result Date: 05/31/2023 Images from the original result were not included.  ..an [employer] of [facility]trasound Medicine Technical sales engineer) accredited practice Center for [facility] [address] [place] [address] Ordering Provider: Wendelyn Halter, MD       GYNECOLOGIC SONOGRAM Kathryn Harris is a 33 y.o. G0P0000 Her LMP is 05-31-23 day 1 of cycle and here today for a pelvic sonogram for anemia/heavy menses. Uterus                      6.92 x 5.53 x 6.98 cm, Total uterine volume 139.95 cc  slightly enlarged uterus,  total uterine length = 10 cm                                 Retroverted uterus normal in size, symmetrical, homogeneous myometrium, no focal abn seen Endometrium          18.6 mm, symmetrical, (Day 1 of cycle) uniform avascular cavity and canal, no evidence of soft tissue intracavitary defects Right ovary             3.5 x 2.3 x 3.0 cm,  mobile Left ovary                2.7 x 1.8 x 1.7 cm, does not appear mobile neg adnexal regions, neg CDS, small amount of clear free fluid present within CDS Technician Comments: GYN US : TA and TV imaging performed - vinyl probe cover used - Chaperone: Keila Retroverted uterus, slightly enlarged, symmetrical, homogeneous myometrium, no focal abn seen Endom thickness = 18.6 mm, (Day 1 of cycle) uniform avascular cavity and canal, no evidence of soft tissue intracavitary defects Nl ov's, Right ovary appears mobile, Left ovary does not appear mobile, no visible evidence of PCOS - neg adnexal regions, neg CDS, small amount of clear free fluid present within CDS Brien Can 05/31/2023 11:37 AM Clinical Impression and recommendations: I have reviewed the sonogram results above, combined with the patient's  current clinical course, below are my impressions and any appropriate recommendations for management based on the sonographic findings. Uterus normal size shape and contour Endometrium a bit thickened but literally started today which is late secretory Ovaries: normal size shape and morphology Wendelyn Halter 05/31/2023 12:32 PM     Reviewed images Reviewed Heme/onc notes Will trial megestrol 40 mg 24 on/4 days off to suppress endometrium Follow up 3 months to see how her periods have done She states she does not really have heavy periods  MEDS ordered this encounter: Meds ordered this encounter  Medications   megestrol (MEGACE) 40 MG tablet    Sig: Take 1 tablet (40 mg total) by mouth daily. Take daily for 24 days on then off for 4 days, repeat monthly    Dispense:  24 tablet    Refill:  11    Orders for this encounter: No orders of the defined types were placed in this encounter.   Impression + Management Plan   ICD-10-CM   1. Other iron  deficiency anemia: trial megestrol, seeing hematologist  D50.8       Follow Up: No follow-ups on file.  All questions were answered.  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    Past Surgical History:  Procedure Laterality Date   EYE SURGERY      OB History     Gravida  0   Para  0   Term  0   Preterm  0   AB  0   Living  0      SAB  0   IAB  0   Ectopic  0   Multiple  0   Live Births  0           No Known Allergies  Social History   Socioeconomic History   Marital status: Significant Other    Spouse name: Not on file   Number of children: Not on file   Years of education: Not on file   Highest education level: Not on file  Occupational History   Not on file  Tobacco Use   Smoking status: Never   Smokeless tobacco: Former    Quit date: 07/01/2007  Vaping Use   Vaping status: Never Used   Substance and Sexual Activity   Alcohol use: Not Currently    Comment: occasionally   Drug use: Not Currently    Types: Marijuana   Sexual activity: Yes    Birth control/protection: None  Other Topics Concern   Not on file  Social History Narrative   Not on file   Social Drivers of Health   Financial Resource Strain: Low Risk  (09/06/2022)   Overall Financial Resource Strain (CARDIA)    Difficulty of Paying Living Expenses: Not very hard  Food Insecurity: No Food Insecurity (05/16/2023)   Hunger Vital Sign    Worried About Running Out of Food in the Last Year: Never true    Ran Out of Food in the Last Year: Never true  Transportation Needs: No Transportation Needs (05/16/2023)   PRAPARE - Administrator, Civil Service (Medical): No    Lack of Transportation (Non-Medical): No  Physical Activity: Sufficiently Active (09/06/2022)   Exercise Vital Sign    Days of Exercise per Week: 5 days    Minutes of Exercise per Session: 40 min  Stress: No Stress Concern Present (09/06/2022)   [employer] of Occupational Health - Occupational Stress Questionnaire    Feeling of Stress : Only a little  Social Connections: Moderately Isolated (09/06/2022)   Social Connection and Isolation Panel [NHANES]    Frequency of Communication with Friends and Family: Three times a week    Frequency of Social Gatherings with Friends and Family: Once a week    Attends Religious Services: Never    Database administrator or Organizations: No    Attends Banker Meetings: Never    Marital Status: Living with partner    Family History  Problem Relation Age of Onset   Hypertension Father    Diabetes Other

## 2023-06-01 ENCOUNTER — Encounter: Payer: Self-pay | Admitting: Obstetrics & Gynecology

## 2023-06-07 DIAGNOSIS — Z6841 Body Mass Index (BMI) 40.0 and over, adult: Secondary | ICD-10-CM | POA: Diagnosis not present

## 2023-06-07 DIAGNOSIS — E66813 Obesity, class 3: Secondary | ICD-10-CM | POA: Diagnosis not present

## 2023-06-07 DIAGNOSIS — Z713 Dietary counseling and surveillance: Secondary | ICD-10-CM | POA: Diagnosis not present

## 2023-07-05 ENCOUNTER — Encounter: Payer: Self-pay | Admitting: *Deleted

## 2023-07-05 ENCOUNTER — Inpatient Hospital Stay: Attending: Hematology

## 2023-07-05 DIAGNOSIS — Z87891 Personal history of nicotine dependence: Secondary | ICD-10-CM | POA: Insufficient documentation

## 2023-07-05 DIAGNOSIS — D500 Iron deficiency anemia secondary to blood loss (chronic): Secondary | ICD-10-CM

## 2023-07-05 DIAGNOSIS — D509 Iron deficiency anemia, unspecified: Secondary | ICD-10-CM | POA: Diagnosis not present

## 2023-07-05 DIAGNOSIS — G932 Benign intracranial hypertension: Secondary | ICD-10-CM | POA: Diagnosis not present

## 2023-07-05 DIAGNOSIS — E538 Deficiency of other specified B group vitamins: Secondary | ICD-10-CM | POA: Insufficient documentation

## 2023-07-05 LAB — CBC
HCT: 37.4 % (ref 36.0–46.0)
Hemoglobin: 11.2 g/dL — ABNORMAL LOW (ref 12.0–15.0)
MCH: 21.7 pg — ABNORMAL LOW (ref 26.0–34.0)
MCHC: 29.9 g/dL — ABNORMAL LOW (ref 30.0–36.0)
MCV: 72.5 fL — ABNORMAL LOW (ref 80.0–100.0)
Platelets: 274 K/uL (ref 150–400)
RBC: 5.16 MIL/uL — ABNORMAL HIGH (ref 3.87–5.11)
RDW: 27.6 % — ABNORMAL HIGH (ref 11.5–15.5)
WBC: 6.8 K/uL (ref 4.0–10.5)
nRBC: 0 % (ref 0.0–0.2)

## 2023-07-05 LAB — IRON AND TIBC
Iron: 37 ug/dL (ref 28–170)
Saturation Ratios: 10 % — ABNORMAL LOW (ref 10.4–31.8)
TIBC: 373 ug/dL (ref 250–450)
UIBC: 336 ug/dL

## 2023-07-05 LAB — FERRITIN: Ferritin: 35 ng/mL (ref 11–307)

## 2023-07-05 LAB — VITAMIN B12: Vitamin B-12: 371 pg/mL (ref 180–914)

## 2023-07-05 LAB — FOLATE: Folate: 11.5 ng/mL

## 2023-07-12 ENCOUNTER — Encounter: Payer: Self-pay | Admitting: *Deleted

## 2023-07-12 ENCOUNTER — Inpatient Hospital Stay: Admitting: Oncology

## 2023-07-12 VITALS — BP 116/68 | HR 71 | Temp 97.4°F | Resp 18 | Ht 65.0 in | Wt 309.0 lb

## 2023-07-12 DIAGNOSIS — E538 Deficiency of other specified B group vitamins: Secondary | ICD-10-CM | POA: Diagnosis not present

## 2023-07-12 DIAGNOSIS — Z87891 Personal history of nicotine dependence: Secondary | ICD-10-CM | POA: Diagnosis not present

## 2023-07-12 DIAGNOSIS — D509 Iron deficiency anemia, unspecified: Secondary | ICD-10-CM | POA: Diagnosis not present

## 2023-07-12 DIAGNOSIS — D500 Iron deficiency anemia secondary to blood loss (chronic): Secondary | ICD-10-CM | POA: Diagnosis not present

## 2023-07-12 NOTE — Progress Notes (Signed)
 [facility] [address] [place] [address]   Clinic Day:  07/12/2023  Referring physician: Jayne Vonn DEL, MD  Patient Care Team: Pcp, No as PCP - General   ASSESSMENT & PLAN:   Assessment:  1.  Severe iron  deficiency anemia: - Patient seen at the request of Dr. Laurence. - 05/08/2023: Hb-6, MCV-55.8.  She received 2 units PRBC.  She has been anemic since 2012.  MCV is also low at 60.6 even with hemoglobin 10.6.  MCV was 72 and hemoglobin was 12.1. - She has occasional heavy menses, regular once a month.  Denies BRBPR/melena.  Positive for ice pica. - She is taking liquid iron  which she is tolerating better than she did iron  tablets since May 2025.  2. Social/family history: - She works in a [employer] and does physical work.  Non-smoker. - Sister has anemia.  No family history of sickle cell anemia or thalassemia. - Maternal grandmother had pancreatic cancer.  Maternal grandfather had lung cancer.   Plan:  1.  Severe iron  deficiency anemia: - Labs from 07/05/23 show iron  saturation is 10%, ferritin 35, TIBC 373 and hemoglobin 11.2 (6).  MCV 72.5 (55.8).  Recommend 1 additional dose of INFeD . -We discussed continuing liquid iron  as she is tolerating well. -Return to clinic in 4 months with labs a few days before.  2.  Low B12 levels: -She is still having significant fatigue. -Labs from 07/05/2023 show a B12 level of 371 which is on the low end of normal. -We discussed giving 1 B12 injection when she gets her iron  along with starting B12 1000 mcg tabs daily. -Will recheck labs in approximately   Orders Placed This Encounter  Procedures   Vitamin B12    Standing Status:   Future    Expected Date:   11/12/2023    Expiration Date:   02/10/2024   Ferritin    Standing Status:   Future    Expected Date:   11/12/2023    Expiration Date:   02/10/2024   Iron  and TIBC ([facility] ONLY)    Standing Status:   Future    Expected Date:   11/12/2023     Expiration Date:   02/10/2024   CBC    Standing Status:   Future    Expected Date:   11/12/2023    Expiration Date:   02/10/2024    Delon FORBES Hope, NP   7/10/20259:31 AM  CHIEF COMPLAINT/PURPOSE OF CONSULT:   Diagnosis: Severe iron  deficiency anemia  Current Therapy: Parenteral iron  therapy  HISTORY OF PRESENT ILLNESS:   Kathryn Harris is a 33 y.o. female who is here for follow-up.  She was last seen by Dr. Rogers on 05/16/2023.  She received 1 dose of INFeD  on 05/18/2023 with great tolerance.  She denies any melena, hematochezia or bright red blood per rectum.  Reports she occasionally will have a heavy menstrual cycle lasting anywhere from 4 to 6 days.  She is currently taking liquid iron  and tolerating better than the iron  tablets.  Feels like her energy levels are still low but much improved from her last visit.  Appetite and energy levels are 100%.  No pain.  She does have chronic allergies with an occasional cough and postnasal drip.   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  Surgical History: Past Surgical History:  Procedure Laterality Date   EYE SURGERY      Social History: Social History   Socioeconomic History   Marital status: Significant Other    Spouse name: Not on file   Number of children: Not on file   Years of education: Not on file   Highest education level: Not on file  Occupational History   Not on file  Tobacco Use   Smoking status: Never   Smokeless tobacco: Former    Quit date: 07/01/2007  Vaping Use   Vaping status: Never Used  Substance and Sexual Activity   Alcohol use: Not Currently    Comment: occasionally   Drug use: Not Currently    Types: Marijuana   Sexual activity: Yes    Birth control/protection: None  Other Topics Concern   Not on file  Social History Narrative   Not on  file   Social Drivers of Health   Financial Resource Strain: Low Risk  (09/06/2022)   Overall Financial Resource Strain (CARDIA)    Difficulty of Paying Living Expenses: Not very hard  Food Insecurity: No Food Insecurity (05/16/2023)   Hunger Vital Sign    Worried About Running Out of Food in the Last Year: Never true    Ran Out of Food in the Last Year: Never true  Transportation Needs: No Transportation Needs (05/16/2023)   PRAPARE - Administrator, Civil Service (Medical): No    Lack of Transportation (Non-Medical): No  Physical Activity: Sufficiently Active (09/06/2022)   Exercise Vital Sign    Days of Exercise per Week: 5 days    Minutes of Exercise per Session: 40 min  Stress: No Stress Concern Present (09/06/2022)   [employer] of Occupational Health - Occupational Stress Questionnaire    Feeling of Stress : Only a little  Social Connections: Moderately Isolated (09/06/2022)   Social Connection and Isolation Panel    Frequency of Communication with Friends and Family: Three times a week    Frequency of Social Gatherings with Friends and Family: Once a week    Attends Religious Services: Never    Database administrator or Organizations: No    Attends Banker Meetings: Never    Marital Status: Living with partner  Intimate Partner Violence: Not At Risk (05/16/2023)   Humiliation, Afraid, Rape, and Kick questionnaire    Fear of Current or Ex-Partner: No    Emotionally Abused: No    Physically Abused: No    Sexually Abused: No    Family History: Family History  Problem Relation Age of Onset   Hypertension Father    Diabetes Other     Current Medications:  Current Outpatient Medications:    albuterol  (VENTOLIN  HFA) 108 (90 Base) MCG/ACT inhaler, Inhale 2 puffs into the lungs every 4 (four) hours as needed., Disp: 6.7 g, Rfl: 0   megestrol  (MEGACE ) 40 MG tablet, Take 1 tablet (40 mg total) by mouth daily. Take daily for 24 days on then off for 4  days, repeat monthly, Disp: 24 tablet, Rfl: 11   Allergies: No Known Allergies  REVIEW OF SYSTEMS:   Review of Systems  Constitutional:  Positive for fatigue.  Respiratory:  Positive for cough.   Gastrointestinal:  Negative for blood in stool, diarrhea and nausea.  Genitourinary:  Negative for hematuria.      VITALS:   Blood pressure 116/68, pulse 71, temperature (!) 97.4 F (36.3 C), temperature source Tympanic, resp. rate 18,  height 5' 5 (1.651 m), weight (!) 309 lb (140.2 kg), SpO2 98%.  Wt Readings from Last 3 Encounters:  07/12/23 (!) 309 lb (140.2 kg)  05/31/23 (!) 313 lb (142 kg)  05/16/23 (!) 312 lb 6.3 oz (141.7 kg)    Body mass index is 51.42 kg/m.   PHYSICAL EXAM:   Physical Exam Constitutional:      Appearance: Normal appearance.  Cardiovascular:     Rate and Rhythm: Normal rate and regular rhythm.  Pulmonary:     Effort: Pulmonary effort is normal.     Breath sounds: Normal breath sounds.  Abdominal:     General: Bowel sounds are normal.     Palpations: Abdomen is soft.  Musculoskeletal:        General: No swelling. Normal range of motion.  Neurological:     Mental Status: She is alert and oriented to person, place, and time. Mental status is at baseline.     LABS:   CBC    Component Value Date/Time   WBC 6.8 07/05/2023 0843   RBC 5.16 (H) 07/05/2023 0843   HGB 11.2 (L) 07/05/2023 0843   HCT 37.4 07/05/2023 0843   PLT 274 07/05/2023 0843   MCV 72.5 (L) 07/05/2023 0843   MCH 21.7 (L) 07/05/2023 0843   MCHC 29.9 (L) 07/05/2023 0843   RDW 27.6 (H) 07/05/2023 0843   LYMPHSABS 1.3 05/08/2023 1432   MONOABS 0.4 05/08/2023 1432   EOSABS 0.2 05/08/2023 1432   BASOSABS 0.0 05/08/2023 1432    CMP    Component Value Date/Time   NA 135 05/08/2023 1432   K 3.5 05/08/2023 1432   CL 106 05/08/2023 1432   CO2 21 (L) 05/08/2023 1432   GLUCOSE 93 05/08/2023 1432   BUN 12 05/08/2023 1432   CREATININE 0.76 05/08/2023 1432   CALCIUM 8.4 (L)  05/08/2023 1432   PROT 7.3 10/09/2015 0255   ALBUMIN 3.5 10/09/2015 0255   AST 14 (L) 10/09/2015 0255   ALT 12 (L) 10/09/2015 0255   ALKPHOS 70 10/09/2015 0255   BILITOT 0.1 (L) 10/09/2015 0255   GFRNONAA >60 05/08/2023 1432   GFRAA >60 06/20/2018 1208    No results found for: CEA1, CEA / No results found for: CEA1, CEA No results found for: PSA1 No results found for: CAN199 No results found for: CAN125  No results found for: TOTALPROTELP, ALBUMINELP, A1GS, A2GS, BETS, BETA2SER, GAMS, MSPIKE, SPEI Lab Results  Component Value Date   TIBC 373 07/05/2023   FERRITIN 35 07/05/2023   IRONPCTSAT 10 (L) 07/05/2023   No results found for: LDH   STUDIES:   No results found.

## 2023-07-14 ENCOUNTER — Encounter: Payer: Self-pay | Admitting: Emergency Medicine

## 2023-07-14 ENCOUNTER — Other Ambulatory Visit: Payer: Self-pay

## 2023-07-14 ENCOUNTER — Ambulatory Visit
Admission: EM | Admit: 2023-07-14 | Discharge: 2023-07-14 | Disposition: A | Discharge status: Home / Self Care | Attending: Family Medicine | Admitting: Family Medicine

## 2023-07-14 DIAGNOSIS — J069 Acute upper respiratory infection, unspecified: Secondary | ICD-10-CM | POA: Diagnosis not present

## 2023-07-14 LAB — POC SARS CORONAVIRUS 2 AG -  ED: SARS Coronavirus 2 Ag: NEGATIVE

## 2023-07-14 MED ORDER — PROMETHAZINE-DM 6.25-15 MG/5ML PO SYRP: 5.0000 mL | ORAL_SOLUTION | Freq: Four times a day (QID) | ORAL | 0 refills | Status: DC | PRN

## 2023-07-14 MED ORDER — FLUTICASONE PROPIONATE 50 MCG/ACT NA SUSP: 1.0000 | Freq: Two times a day (BID) | NASAL | 2 refills | Status: DC

## 2023-07-14 NOTE — ED Provider Notes (Signed)
 [facility]    CSN: [id number] Arrival date & time: 07/14/23  0813      History   Chief Complaint Chief Complaint  Patient presents with   Cough    HPI Kathryn Harris is a 33 y.o. female.   Patient presenting today with 2-day history of cough, sore throat, loss of taste and smell, congestion.  Denies fever, body aches, chest pain, shortness of breath, abdominal pain, vomiting, diarrhea.  History of seasonal allergies and asthma on as needed regimen for both.  Not trying anything over-the-counter for symptoms.    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 Authorizing Provider  fluticasone  (FLONASE ) 50 MCG/ACT nasal spray Place 1 spray into both nostrils 2 (two) times daily. 07/14/23  Yes Stuart Vernell Norris, PA-C  promethazine -dextromethorphan (PROMETHAZINE -DM) 6.25-15 MG/5ML syrup Take 5 mLs by mouth 4 (four) times daily as needed. 07/14/23  Yes Stuart Vernell Norris, PA-C  albuterol  (VENTOLIN  HFA) 108 (90 Base) MCG/ACT inhaler Inhale 2 puffs into the lungs every 4 (four) hours as needed. 01/11/20   Knapp, Iva, MD  megestrol  (MEGACE ) 40 MG tablet Take 1 tablet (40 mg total) by mouth daily. Take daily for 24 days on then off for 4 days, repeat monthly Patient not taking: Reported on 07/14/2023 05/31/23   Jayne Vonn DEL, MD    Family History Family History   Problem Relation Age of Onset   Hypertension Father    Diabetes Other     Social History Social History   Tobacco Use   Smoking status: Never   Smokeless tobacco: Former    Quit date: 07/01/2007  Vaping Use   Vaping status: Never Used  Substance Use Topics   Alcohol use: Not Currently    Comment: occasionally   Drug use: Not Currently    Types: Marijuana     Allergies   Patient has no known allergies.   Review of Systems Review of Systems Per HPI  Physical Exam Triage Vital Signs ED Triage Vitals  Encounter Vitals Group     BP 07/14/23 0829 117/81     Girls Systolic BP Percentile --      Girls Diastolic BP Percentile --      Boys Systolic BP Percentile --      Boys Diastolic BP Percentile --      Pulse Rate 07/14/23 0829 71     Resp 07/14/23 0829 20     Temp 07/14/23 0829 98.3 F (36.8 C)     Temp Source 07/14/23 0829 Oral     SpO2 07/14/23 0829 96 %  Weight --      Height --      Head Circumference --      Peak Flow --      Pain Score 07/14/23 0828 2     Pain Loc --      Pain Education --      Exclude from Growth Chart --    No data found.  Updated Vital Signs BP 117/81 (BP Location: Right Arm)   Pulse 71   Temp 98.3 F (36.8 C) (Oral)   Resp 20   LMP 06/28/2023 (Approximate)   SpO2 96%   Visual Acuity Right Eye Distance:   Left Eye Distance:   Bilateral Distance:    Right Eye Near:   Left Eye Near:    Bilateral Near:     Physical Exam Vitals and nursing note reviewed.  Constitutional:      Appearance: Normal appearance.  HENT:     Head: Atraumatic.     Right Ear: Tympanic membrane and external ear normal.     Left Ear: Tympanic membrane and external ear normal.     Nose: Rhinorrhea present.     Mouth/Throat:     Mouth: Mucous membranes are moist.     Pharynx: Posterior oropharyngeal erythema present.  Eyes:     Extraocular Movements: Extraocular movements intact.     Conjunctiva/sclera: Conjunctivae normal.   Cardiovascular:     Rate and Rhythm: Normal rate and regular rhythm.     Heart sounds: Normal heart sounds.  Pulmonary:     Effort: Pulmonary effort is normal.     Breath sounds: Normal breath sounds. No wheezing or rales.  Musculoskeletal:        General: Normal range of motion.     Cervical back: Normal range of motion and neck supple.  Skin:    General: Skin is warm and dry.  Neurological:     Mental Status: She is alert and oriented to person, place, and time.  Psychiatric:        Mood and Affect: Mood normal.        Thought Content: Thought content normal.      UC Treatments / Results  Labs (all labs ordered are listed, but only abnormal results are displayed) Labs Reviewed  POC SARS CORONAVIRUS 2 AG -  ED    EKG   Radiology No results found.  Procedures Procedures (including critical care time)  Medications Ordered in UC Medications - No data to display  Initial Impression / Assessment and Plan / UC Course  I have reviewed the triage vital signs and the nursing notes.  Pertinent labs & imaging results that were available during my care of the patient were reviewed by me and considered in my medical decision making (see chart for details).     Vitals and exam very reassuring today and suggestive of a viral respiratory infection, rapid COVID-negative.  Will treat with Phenergan  DM, Flonase , supportive over-the-counter medications and home care.  Return for worsening symptoms.  Final Clinical Impressions(s) / UC Diagnoses   Final diagnoses:  Viral URI with cough   Discharge Instructions   None    ED Prescriptions     Medication Sig Dispense Auth. Provider   promethazine -dextromethorphan (PROMETHAZINE -DM) 6.25-15 MG/5ML syrup Take 5 mLs by mouth 4 (four) times daily as needed. 100 mL Stuart Vernell Norris, PA-C   fluticasone  (FLONASE ) 50 MCG/ACT nasal spray Place 1 spray into both nostrils 2 (two) times daily. 16 g Stuart Vernell Norris, PA-C  PDMP not reviewed this encounter.   Stuart Millman [place], [place] 07/14/23 [phone]

## 2023-07-14 NOTE — ED Triage Notes (Signed)
 Pt reports cough, sore throat, loss of taste and smell since Thursday. Denies body aches, fever.

## 2023-07-16 ENCOUNTER — Inpatient Hospital Stay

## 2023-07-16 ENCOUNTER — Encounter: Payer: Self-pay | Admitting: *Deleted

## 2023-07-16 VITALS — BP 119/78 | HR 59 | Temp 98.1°F | Resp 18

## 2023-07-16 DIAGNOSIS — E538 Deficiency of other specified B group vitamins: Secondary | ICD-10-CM | POA: Diagnosis not present

## 2023-07-16 DIAGNOSIS — D509 Iron deficiency anemia, unspecified: Secondary | ICD-10-CM | POA: Diagnosis not present

## 2023-07-16 DIAGNOSIS — Z87891 Personal history of nicotine dependence: Secondary | ICD-10-CM | POA: Diagnosis not present

## 2023-07-16 DIAGNOSIS — D500 Iron deficiency anemia secondary to blood loss (chronic): Secondary | ICD-10-CM

## 2023-07-16 MED ORDER — CETIRIZINE HCL 10 MG/ML IV SOLN
10.0000 mg | Freq: Once | INTRAVENOUS | Status: AC
  Administered 2023-07-16: 10 mg via INTRAVENOUS
  Filled 2023-07-16: qty 1

## 2023-07-16 MED ORDER — FAMOTIDINE IN NACL 20-0.9 MG/50ML-% IV SOLN
20.0000 mg | Freq: Once | INTRAVENOUS | Status: AC
  Administered 2023-07-16: 20 mg via INTRAVENOUS
  Filled 2023-07-16: qty 50

## 2023-07-16 MED ORDER — SODIUM CHLORIDE 0.9 % IV SOLN
1000.0000 mg | Freq: Once | INTRAVENOUS | Status: AC
  Administered 2023-07-16: 1000 mg via INTRAVENOUS
  Filled 2023-07-16: qty 20

## 2023-07-16 MED ORDER — CYANOCOBALAMIN 1000 MCG/ML IJ SOLN
1000.0000 ug | Freq: Once | INTRAMUSCULAR | Status: AC
  Administered 2023-07-16: 1000 ug via INTRAMUSCULAR
  Filled 2023-07-16: qty 1

## 2023-07-16 MED ORDER — METHYLPREDNISOLONE SODIUM SUCC 125 MG IJ SOLR
125.0000 mg | Freq: Once | INTRAMUSCULAR | Status: AC
  Administered 2023-07-16: 125 mg via INTRAVENOUS
  Filled 2023-07-16: qty 2

## 2023-07-16 MED ORDER — ACETAMINOPHEN 325 MG PO TABS
650.0000 mg | ORAL_TABLET | Freq: Once | ORAL | Status: AC
  Administered 2023-07-16: 650 mg via ORAL
  Filled 2023-07-16: qty 2

## 2023-07-16 MED ORDER — SODIUM CHLORIDE 0.9 % IV SOLN: INTRAVENOUS | Status: DC

## 2023-07-16 NOTE — Progress Notes (Signed)
Infed iron infusion given per orders. Patient tolerated it well without problems. Vitals stable and discharged home from clinic ambulatory. Follow up as scheduled.  

## 2023-07-16 NOTE — Progress Notes (Signed)
 Kathryn Harris presents today for injection per MD orders. B12 1000mcg administered SQ in left Upper Arm. Administration without incident. Patient tolerated well.

## 2023-07-16 NOTE — Progress Notes (Signed)
 Infed given today per MD orders. Tolerated infusion without adverse affects. Vital signs stable. No complaints at this time. Discharged from clinic ambulatory in stable condition. Alert and oriented x 3. F/U with [facility] as scheduled.

## 2023-07-16 NOTE — Patient Instructions (Signed)
 [facility] - A DEPT OF [facility]  Discharge Instructions: Thank you for choosing [facility] to provide your oncology and hematology care.  If you have a lab appointment with the Cancer Center - please note that after April 8th, 2024, all labs will be drawn in the cancer center.  You do not have to check in or register with the main entrance as you have in the past but will complete your check-in in the cancer center.  Wear comfortable clothing and clothing appropriate for easy access to any Portacath or PICC line.   We strive to give you quality time with your provider. You may need to reschedule your appointment if you arrive late (15 or more minutes).  Arriving late affects you and other patients whose appointments are after yours.  Also, if you miss three or more appointments without notifying the office, you may be dismissed from the clinic at the provider's discretion.      For prescription refill requests, have your pharmacy contact our office and allow 72 hours for refills to be completed.    Today you received the following chemotherapy and/or immunotherapy agents Infed. Iron Dextran Injection What is this medication? IRON DEXTRAN (EYE ern DEX tran) treats low levels of iron in your body. Iron is a mineral that plays an important role in making red blood cells, which carry oxygen from your lungs to the rest of your body. This medicine may be used for other purposes; ask your health care provider or pharmacist if you have questions. COMMON BRAND NAME(S): Dexferrum, INFeD What should I tell my care team before I take this medication? They need to know if you have any of these conditions: Anemia not caused by low iron levels Heart disease High levels of iron in the blood Kidney disease Liver disease An unusual or allergic reaction to iron, other medications, foods, dyes, or preservatives Pregnant or trying to get  pregnant Breastfeeding How should I use this medication? This medication is injected into a vein or a muscle. It is given by your care team in a hospital or clinic setting. Talk to your care team about the use of this medication in children. While it may be prescribed for children as young as 4 months for selected conditions, precautions do apply. Overdosage: If you think you have taken too much of this medicine contact a poison control center or emergency room at once. NOTE: This medicine is only for you. Do not share this medicine with others. What if I miss a dose? Keep appointments for follow-up doses. It is important not to miss your dose. Call your care team if you are unable to keep an appointment. What may interact with this medication? Do not take this medication with any of the following: Deferoxamine Dimercaprol Other iron products This medication may also interact with the following: Chloramphenicol Deferasirox This list may not describe all possible interactions. Give your health care provider a list of all the medicines, herbs, non-prescription drugs, or dietary supplements you use. Also tell them if you smoke, drink alcohol, or use illegal drugs. Some items may interact with your medicine. What should I watch for while using this medication? Visit your care team for regular checks on your progress. Tell your care team if your symptoms do not start to get better or if they get worse. You may need blood work while taking this medication. You may need to eat more foods that contain  iron. Talk to your care team. Foods that contain iron include whole grains/cereals, dried fruits, beans, peas, leafy green vegetables, and organ meats (liver, kidney). Long-term use of this medication may increase your risk of some cancers. Talk to your care team about your risk of cancer. What side effects may I notice from receiving this medication? Side effects that you should report to your care  team as soon as possible: Allergic reactions--skin rash, itching, hives, swelling of the face, lips, tongue, or throat Low blood pressure--dizziness, feeling faint or lightheaded, blurry vision Shortness of breath Side effects that usually do not require medical attention (report to your care team if they continue or are bothersome): Flushing Headache Joint pain Muscle pain Nausea Pain, redness, or irritation at injection site This list may not describe all possible side effects. Call your doctor for medical advice about side effects. You may report side effects to FDA at 1-800-FDA-1088. Where should I keep my medication? This medication is given in a hospital or clinic. It will not be stored at home. NOTE: This sheet is a summary. It may not cover all possible information. If you have questions about this medicine, talk to your doctor, pharmacist, or health care provider.  2024 Elsevier/Gold Standard (2022-08-09 00:00:00)      To help prevent nausea and vomiting after your treatment, we encourage you to take your nausea medication as directed.  BELOW ARE SYMPTOMS THAT SHOULD BE REPORTED IMMEDIATELY: *FEVER GREATER THAN 100.4 F (38 C) OR HIGHER *CHILLS OR SWEATING *NAUSEA AND VOMITING THAT IS NOT CONTROLLED WITH YOUR NAUSEA MEDICATION *UNUSUAL SHORTNESS OF BREATH *UNUSUAL BRUISING OR BLEEDING *URINARY PROBLEMS (pain or burning when urinating, or frequent urination) *BOWEL PROBLEMS (unusual diarrhea, constipation, pain near the anus) TENDERNESS IN MOUTH AND THROAT WITH OR WITHOUT PRESENCE OF ULCERS (sore throat, sores in mouth, or a toothache) UNUSUAL RASH, SWELLING OR PAIN  UNUSUAL VAGINAL DISCHARGE OR ITCHING   Items with * indicate a potential emergency and should be followed up as soon as possible or go to the Emergency Department if any problems should occur.  Please show the CHEMOTHERAPY ALERT CARD or IMMUNOTHERAPY ALERT CARD at check-in to the Emergency Department and triage  nurse.  Should you have questions after your visit or need to cancel or reschedule your appointment, please contact [facility] [place] - A DEPT OF [facility] [phone]  and follow the prompts.  Office hours are 8:00 a.m. to 4:30 p.m. Monday - Friday. Please note that voicemails left after 4:00 p.m. may not be returned until the following business day.  We are closed weekends and major holidays. You have access to a nurse at all times for urgent questions. Please call the main number to the clinic [phone] and follow the prompts.  For any non-urgent questions, you may also contact your provider using MyChart. We now offer e-Visits for anyone 19 and older to request care online for non-urgent symptoms. For details visit [email or web address].   Also download the MyChart app! Go to the app store, search "MyChart", open the app, select [place], and log in with your MyChart username and password.

## 2023-08-14 DIAGNOSIS — E66813 Obesity, class 3: Secondary | ICD-10-CM | POA: Diagnosis not present

## 2023-08-14 DIAGNOSIS — Z6841 Body Mass Index (BMI) 40.0 and over, adult: Secondary | ICD-10-CM | POA: Diagnosis not present

## 2023-08-17 DIAGNOSIS — Z8249 Family history of ischemic heart disease and other diseases of the circulatory system: Secondary | ICD-10-CM | POA: Diagnosis not present

## 2023-08-17 DIAGNOSIS — Z833 Family history of diabetes mellitus: Secondary | ICD-10-CM | POA: Diagnosis not present

## 2023-08-17 DIAGNOSIS — D500 Iron deficiency anemia secondary to blood loss (chronic): Secondary | ICD-10-CM | POA: Diagnosis not present

## 2023-08-23 DIAGNOSIS — Z833 Family history of diabetes mellitus: Secondary | ICD-10-CM | POA: Diagnosis not present

## 2023-08-23 DIAGNOSIS — D509 Iron deficiency anemia, unspecified: Secondary | ICD-10-CM | POA: Diagnosis not present

## 2023-08-23 DIAGNOSIS — E66813 Obesity, class 3: Secondary | ICD-10-CM | POA: Diagnosis not present

## 2023-08-23 DIAGNOSIS — M545 Low back pain, unspecified: Secondary | ICD-10-CM | POA: Diagnosis not present

## 2023-08-23 DIAGNOSIS — Z2821 Immunization not carried out because of patient refusal: Secondary | ICD-10-CM | POA: Diagnosis not present

## 2023-08-23 DIAGNOSIS — H18603 Keratoconus, unspecified, bilateral: Secondary | ICD-10-CM | POA: Diagnosis not present

## 2023-08-23 DIAGNOSIS — Z0000 Encounter for general adult medical examination without abnormal findings: Secondary | ICD-10-CM | POA: Diagnosis not present

## 2023-08-23 DIAGNOSIS — G932 Benign intracranial hypertension: Secondary | ICD-10-CM | POA: Diagnosis not present

## 2023-08-23 DIAGNOSIS — Z6841 Body Mass Index (BMI) 40.0 and over, adult: Secondary | ICD-10-CM | POA: Diagnosis not present

## 2023-08-23 DIAGNOSIS — D500 Iron deficiency anemia secondary to blood loss (chronic): Secondary | ICD-10-CM | POA: Diagnosis not present

## 2023-08-27 ENCOUNTER — Telehealth: Payer: Self-pay

## 2023-08-27 ENCOUNTER — Ambulatory Visit
Admission: RE | Admit: 2023-08-27 | Discharge: 2023-08-27 | Disposition: A | Discharge status: Home / Self Care | Source: Ambulatory Visit | Attending: Nurse Practitioner | Admitting: Nurse Practitioner

## 2023-08-27 VITALS — BP 120/87 | HR 109 | Temp 98.3°F | Resp 16

## 2023-08-27 DIAGNOSIS — U071 COVID-19: Secondary | ICD-10-CM | POA: Diagnosis not present

## 2023-08-27 LAB — POC SOFIA SARS ANTIGEN FIA: SARS Coronavirus 2 Ag: POSITIVE — AB

## 2023-08-27 MED ORDER — FLUTICASONE PROPIONATE 50 MCG/ACT NA SUSP: 2.0000 | Freq: Every day | NASAL | 0 refills | Status: DC

## 2023-08-27 MED ORDER — PROMETHAZINE-DM 6.25-15 MG/5ML PO SYRP
5.0000 mL | ORAL_SOLUTION | Freq: Four times a day (QID) | ORAL | 0 refills | Status: AC | PRN
Start: 2023-08-27 — End: ?

## 2023-08-27 MED ORDER — PAXLOVID (300/100) 20 X 150 MG & 10 X 100MG PO TBPK: 3.0000 | ORAL_TABLET | Freq: Two times a day (BID) | ORAL | 0 refills | Status: AC

## 2023-08-27 NOTE — Discharge Instructions (Signed)
 Your COVID test was positive today. Take medication as prescribed. Increase fluids and allow for plenty of rest. You may take over-the-counter Tylenol  or ibuprofen  as needed for pain, fever, general discomfort. Recommend the use of normal saline nasal spray throughout the day for nasal congestion and runny nose. For your cough, recommend use of a humidifier in your bedroom at nighttime during sleep and sleeping elevated on pillows while symptoms persist. If you develop fever, you will need to remain home until you have been fever free for 24 hours with no medication. Go to the emergency department if you experience shortness of breath, difficulty breathing, or other concerns. Follow-up as needed.

## 2023-08-27 NOTE — ED Provider Notes (Signed)
 [facility]    CSN: [id number] Arrival date & time: 08/27/23  1657      History   Chief Complaint Chief Complaint  Patient presents with   Cough    Can't taste  or smell , body aches, chills and bad cough - Entered by patient    HPI Kathryn Harris is a 33 y.o. female.   The history is provided by the patient.   Patient presents with a 3-day history of loss of taste and smell, nasal congestion, cough, subjective fever, and generalized bodyaches.  Patient denies headache, ear pain, wheezing, difficulty breathing, chest pain, abdominal pain, nausea, vomiting, diarrhea, or rash.  So far, patient states she has been taking Mucinex  for her symptoms.  She denies any obvious close sick contacts.  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 Authorizing Provider  fluticasone  (FLONASE ) 50 MCG/ACT nasal spray Place 2 sprays into both nostrils daily. 08/27/23  Yes Leath-Warren, Etta PARAS, NP  nirmatrelvir/ritonavir (PAXLOVID , 300/100,) 20 x 150 MG & 10 x 100MG  TBPK Take 3 tablets by mouth 2 (two) times daily for 5 days. Patient GFR is > 60. Take nirmatrelvir (150 mg) two tablets twice daily for 5 days and ritonavir (100 mg) one tablet twice daily for 5 days. 08/27/23 09/01/23 Yes Leath-Warren, Etta PARAS, NP  promethazine -dextromethorphan (PROMETHAZINE -DM) 6.25-15 MG/5ML syrup  Take 5 mLs by mouth 4 (four) times daily as needed. 08/27/23  Yes Leath-Warren, Etta PARAS, NP  albuterol  (VENTOLIN  HFA) 108 (90 Base) MCG/ACT inhaler Inhale 2 puffs into the lungs every 4 (four) hours as needed. 01/11/20   Knapp, Iva, MD  megestrol  (MEGACE ) 40 MG tablet Take 1 tablet (40 mg total) by mouth daily. Take daily for 24 days on then off for 4 days, repeat monthly Patient not taking: Reported on 07/14/2023 05/31/23   Jayne Vonn DEL, MD    Family History Family History  Problem Relation Age of Onset   Hypertension Father    Diabetes Other     Social History Social History   Tobacco Use   Smoking status: Never   Smokeless tobacco: Former    Quit date: 07/01/2007  Vaping Use   Vaping status: Never Used  Substance Use Topics   Alcohol use: Not Currently    Comment: occasionally   Drug use: Not Currently    Types: Marijuana     Allergies   Patient has no known allergies.   Review of Systems Review of Systems Per HPI  Physical Exam Triage Vital Signs ED Triage Vitals [08/27/23 1709]  Encounter Vitals Group  BP 120/87     Girls Systolic BP Percentile      Girls Diastolic BP Percentile      Boys Systolic BP Percentile      Boys Diastolic BP Percentile      Pulse Rate (!) 109     Resp 16     Temp 98.3 F (36.8 C)     Temp Source Oral     SpO2 95 %     Weight      Height      Head Circumference      Peak Flow      Pain Score 7     Pain Loc      Pain Education      Exclude from Growth Chart    No data found.  Updated Vital Signs BP 120/87 (BP Location: Right Arm)   Pulse (!) 109   Temp 98.3 F (36.8 C) (Oral)   Resp 16   LMP 08/22/2023 (Exact Date) Comment: start  SpO2 95%   Visual Acuity Right Eye Distance:   Left Eye Distance:   Bilateral Distance:    Right Eye Near:   Left Eye Near:    Bilateral Near:     Physical Exam Constitutional:      General: She is not in acute distress.    Appearance: Normal appearance. She is  well-developed.  HENT:     Head: Normocephalic and atraumatic.     Right Ear: Tympanic membrane, ear canal and external ear normal.     Left Ear: Tympanic membrane, ear canal and external ear normal.     Nose: Congestion present.     Right Turbinates: Enlarged and swollen.     Left Turbinates: Enlarged and swollen.     Right Sinus: No maxillary sinus tenderness or frontal sinus tenderness.     Left Sinus: No maxillary sinus tenderness or frontal sinus tenderness.     Mouth/Throat:     Lips: Pink.     Mouth: Mucous membranes are moist.     Pharynx: Postnasal drip present. No pharyngeal swelling, oropharyngeal exudate, posterior oropharyngeal erythema or uvula swelling.     Comments: Cobblestoning present to posterior oropharynx  Eyes:     Extraocular Movements: Extraocular movements intact.     Conjunctiva/sclera: Conjunctivae normal.     Pupils: Pupils are equal, round, and reactive to light.  Neck:     Thyroid: No thyromegaly.     Trachea: No tracheal deviation.  Cardiovascular:     Rate and Rhythm: Normal rate and regular rhythm.     Pulses: Normal pulses.     Heart sounds: Normal heart sounds.  Pulmonary:     Effort: Pulmonary effort is normal. No respiratory distress.     Breath sounds: Normal breath sounds. No stridor. No wheezing, rhonchi or rales.  Abdominal:     General: Bowel sounds are normal. There is no distension.     Palpations: Abdomen is soft.     Tenderness: There is no abdominal tenderness.  Musculoskeletal:     Cervical back: Normal range of motion and neck supple.  Skin:    General: Skin is warm and dry.  Neurological:     General: No focal deficit present.     Mental Status: She is alert and oriented to person, place, and time.  Psychiatric:        Behavior: Behavior normal.        Thought Content: Thought content normal.  Judgment: Judgment normal.      UC Treatments / Results  Labs (all labs ordered are listed, but only abnormal results  are displayed) Labs Reviewed  POC SOFIA SARS ANTIGEN FIA - Abnormal; Notable for the following components:      Result Value   SARS Coronavirus 2 Ag Positive (*)    All other components within normal limits    EKG   Radiology No results found.  Procedures Procedures (including critical care time)  Medications Ordered in UC Medications - No data to display  Initial Impression / Assessment and Plan / UC Course  I have reviewed the triage vital signs and the nursing notes.  Pertinent labs & imaging results that were available during my care of the patient were reviewed by me and considered in my medical decision making (see chart for details).  COVID test is positive.  Patient has elected to begin Paxlovid .  Symptomatic treatment also provided with fluticasone  50 mcg nasal spray, and Promethazine  DM for the cough.  Supportive care recommendations were provided and discussed with the patient to include fluids, rest, over-the-counter analgesics, normal saline nasal spray, and use of a humidifier during sleep.  Discussed indications with patient regarding follow-up.  Patient is in agreement with this plan of care and verbalizes understanding.  All questions were answered.  Patient stable for discharge.  Work note was provided.  Final Clinical Impressions(s) / UC Diagnoses   Final diagnoses:  COVID     Discharge Instructions      Your COVID test was positive today. Take medication as prescribed. Increase fluids and allow for plenty of rest. You may take over-the-counter Tylenol  or ibuprofen  as needed for pain, fever, general discomfort. Recommend the use of normal saline nasal spray throughout the day for nasal congestion and runny nose. For your cough, recommend use of a humidifier in your bedroom at nighttime during sleep and sleeping elevated on pillows while symptoms persist. If you develop fever, you will need to remain home until you have been fever free for 24 hours with no  medication. Go to the emergency department if you experience shortness of breath, difficulty breathing, or other concerns. Follow-up as needed.     ED Prescriptions     Medication Sig Dispense Auth. Provider   nirmatrelvir/ritonavir (PAXLOVID , 300/100,) 20 x 150 MG & 10 x 100MG  TBPK Take 3 tablets by mouth 2 (two) times daily for 5 days. Patient GFR is > 60. Take nirmatrelvir (150 mg) two tablets twice daily for 5 days and ritonavir (100 mg) one tablet twice daily for 5 days. 30 tablet Leath-Warren, Etta PARAS, NP   fluticasone  (FLONASE ) 50 MCG/ACT nasal spray Place 2 sprays into both nostrils daily. 16 g Leath-Warren, Etta PARAS, NP   promethazine -dextromethorphan (PROMETHAZINE -DM) 6.25-15 MG/5ML syrup Take 5 mLs by mouth 4 (four) times daily as needed. 118 mL Leath-Warren, Etta PARAS, NP      PDMP not reviewed this encounter.   Gilmer Etta PARAS, NP 08/27/23 1734

## 2023-08-27 NOTE — ED Triage Notes (Signed)
 Pt reports she cannot taste, smell, has a cough, and body aches x 3 days

## 2023-08-28 ENCOUNTER — Encounter (INDEPENDENT_AMBULATORY_CARE_PROVIDER_SITE_OTHER): Payer: Self-pay | Admitting: *Deleted

## 2023-09-13 DIAGNOSIS — H4710 Unspecified papilledema: Secondary | ICD-10-CM | POA: Diagnosis not present

## 2023-09-13 DIAGNOSIS — G935 Compression of brain: Secondary | ICD-10-CM | POA: Diagnosis not present

## 2023-09-17 ENCOUNTER — Ambulatory Visit
Admission: EM | Admit: 2023-09-17 | Discharge: 2023-09-17 | Disposition: A | Discharge status: Home / Self Care | Attending: Family Medicine | Admitting: Family Medicine

## 2023-09-17 DIAGNOSIS — J4541 Moderate persistent asthma with (acute) exacerbation: Secondary | ICD-10-CM

## 2023-09-17 MED ORDER — DEXAMETHASONE SODIUM PHOSPHATE 10 MG/ML IJ SOLN
10.0000 mg | Freq: Once | INTRAMUSCULAR | Status: AC
  Administered 2023-09-17: 10 mg via INTRAMUSCULAR

## 2023-09-17 MED ORDER — PROMETHAZINE-DM 6.25-15 MG/5ML PO SYRP: 5.0000 mL | ORAL_SOLUTION | Freq: Four times a day (QID) | ORAL | 0 refills | Status: DC | PRN

## 2023-09-17 MED ORDER — FLUTICASONE PROPIONATE HFA 110 MCG/ACT IN AERO: 2.0000 | INHALATION_SPRAY | Freq: Two times a day (BID) | RESPIRATORY_TRACT | 0 refills | Status: DC

## 2023-09-17 NOTE — ED Triage Notes (Signed)
 Pt being seen in UC for shortness of breath, dry cough, and body aches. Pt reports having dx of covid the end of August, since then has had shortness of breath and trouble catching breath. Pt denies fevers. Pt reports using albuterol  inhaler with no relief.

## 2023-09-17 NOTE — ED Provider Notes (Signed)
 [facility]    CSN: [id number] Arrival date & time: 09/17/23  1300      History   Chief Complaint Chief Complaint  Patient presents with   Cough   Generalized Body Aches   Shortness of Breath    HPI Kathryn Harris is a 33 y.o. female.   Patient presenting today with ongoing dry cough, wheezes, body aches, shortness of breath with exertion after having COVID several weeks ago.  Denies fever, chest pain, abdominal pain, vomiting, diarrhea.  Has been using albuterol  inhaler several times daily with minimal relief.  Does have a history of asthma just on the albuterol  as needed.    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 Authorizing Provider  fluticasone  (FLOVENT  HFA) 110 MCG/ACT inhaler Inhale 2 puffs into the lungs 2 (two) times daily. Rinse mouth with water after each use 09/17/23  Yes Stuart Vernell Norris, PA-C  promethazine -dextromethorphan (PROMETHAZINE -DM) 6.25-15 MG/5ML syrup Take 5 mLs by mouth 4 (four) times daily as needed. 09/17/23  Yes Stuart Vernell Norris, PA-C  albuterol  (VENTOLIN  HFA) 108 [phone] Base) MCG/ACT inhaler Inhale 2 puffs into the lungs every 4 (four) hours as needed. 01/11/20   Knapp, Iva, MD  fluticasone  (FLONASE ) 50 MCG/ACT nasal spray Place 2 sprays into both nostrils daily. Patient not taking: Reported on 09/17/2023 08/27/23    Leath-Warren, Etta PARAS, NP  megestrol  (MEGACE ) 40 MG tablet Take 1 tablet (40 mg total) by mouth daily. Take daily for 24 days on then off for 4 days, repeat monthly Patient not taking: Reported on 07/14/2023 05/31/23   Jayne Vonn DEL, MD  promethazine -dextromethorphan (PROMETHAZINE -DM) 6.25-15 MG/5ML syrup Take 5 mLs by mouth 4 (four) times daily as needed. Patient not taking: Reported on 09/17/2023 08/27/23   Leath-Warren, Etta PARAS, NP    Family History Family History  Problem Relation Age of Onset   Hypertension Father    Diabetes Other     Social History Social History   Tobacco Use   Smoking status: Never   Smokeless tobacco: Former    Quit date: 07/01/2007  Vaping Use   Vaping status: Never Used  Substance Use Topics   Alcohol use: Not Currently    Comment: occasionally   Drug use: Not Currently    Types: Marijuana     Allergies   Patient has no known allergies.   Review of Systems Review of Systems Per HPI  Physical Exam Triage Vital Signs ED Triage Vitals [09/17/23 1352]  Encounter Vitals Group     BP 123/84     Girls Systolic  BP Percentile      Girls Diastolic BP Percentile      Boys Systolic BP Percentile      Boys Diastolic BP Percentile      Pulse Rate 83     Resp 20     Temp 97.9 F (36.6 C)     Temp Source Oral     SpO2 98 %     Weight      Height      Head Circumference      Peak Flow      Pain Score 5     Pain Loc      Pain Education      Exclude from Growth Chart    No data found.  Updated Vital Signs BP 123/84 (BP Location: Right Arm)   Pulse 83   Temp 97.9 F (36.6 C) (Oral)   Resp 20   LMP 08/22/2023 (Exact Date) Comment: start  SpO2 98%   Visual Acuity Right Eye Distance:   Left Eye Distance:   Bilateral Distance:    Right Eye Near:   Left Eye Near:    Bilateral Near:     Physical Exam Vitals and nursing note reviewed.  Constitutional:      Appearance: Normal appearance.  HENT:     Head: Atraumatic.     Right  Ear: Tympanic membrane and external ear normal.     Left Ear: Tympanic membrane and external ear normal.     Nose: Nose normal.     Mouth/Throat:     Mouth: Mucous membranes are moist.     Pharynx: No posterior oropharyngeal erythema.  Eyes:     Extraocular Movements: Extraocular movements intact.     Conjunctiva/sclera: Conjunctivae normal.  Cardiovascular:     Rate and Rhythm: Normal rate and regular rhythm.     Heart sounds: Normal heart sounds.  Pulmonary:     Effort: Pulmonary effort is normal.     Breath sounds: Wheezing present. No rales.  Musculoskeletal:        General: Normal range of motion.     Cervical back: Normal range of motion and neck supple.  Skin:    General: Skin is warm and dry.  Neurological:     Mental Status: She is alert and oriented to person, place, and time.  Psychiatric:        Mood and Affect: Mood normal.        Thought Content: Thought content normal.      UC Treatments / Results  Labs (all labs ordered are listed, but only abnormal results are displayed) Labs Reviewed - No data to display  EKG   Radiology No results found.  Procedures Procedures (including critical care time)  Medications Ordered in UC Medications  dexamethasone  (DECADRON ) injection 10 mg (10 mg Intramuscular Given 09/17/23 1455)    Initial Impression / Assessment and Plan / UC Course  I have reviewed the triage vital signs and the nursing notes.  Pertinent labs & imaging results that were available during my care of the patient were reviewed by me and considered in my medical decision making (see chart for details).     Suspect asthma exacerbation secondary to recent COVID infection.  Will treat with IM Decadron , Flovent , albuterol  as needed, Phenergan  DM, supportive over-the-counter medications and home care.  Return for worsening symptoms.  Final Clinical Impressions(s) / UC Diagnoses   Final diagnoses:  Moderate persistent asthma with acute exacerbation      Discharge Instructions  We have given you a steroid shot today to help with the inflammation and wheezing in your lungs.  I have prescribed a steroid inhaler to be used twice daily until symptoms resolve in addition to your rescue inhaler every 4 hours as needed and a cough syrup.  Follow-up for worsening or unresolving symptoms    ED Prescriptions     Medication Sig Dispense Auth. Provider   fluticasone  (FLOVENT  HFA) 110 MCG/ACT inhaler Inhale 2 puffs into the lungs 2 (two) times daily. Rinse mouth with water after each use 1 each Stuart Vernell Norris, PA-C   promethazine -dextromethorphan (PROMETHAZINE -DM) 6.25-15 MG/5ML syrup Take 5 mLs by mouth 4 (four) times daily as needed. 100 mL Stuart Vernell Norris, [place]      PDMP not reviewed this encounter.   Stuart Vernell Norris, PA-C 09/17/23 1521

## 2023-09-17 NOTE — Discharge Instructions (Signed)
 We have given you a steroid shot today to help with the inflammation and wheezing in your lungs.  I have prescribed a steroid inhaler to be used twice daily until symptoms resolve in addition to your rescue inhaler every 4 hours as needed and a cough syrup.  Follow-up for worsening or unresolving symptoms

## 2023-09-18 ENCOUNTER — Encounter: Payer: Self-pay | Admitting: Women's Health

## 2023-09-18 ENCOUNTER — Encounter: Admitting: Women's Health

## 2023-09-19 ENCOUNTER — Telehealth (INDEPENDENT_AMBULATORY_CARE_PROVIDER_SITE_OTHER): Payer: Self-pay

## 2023-09-19 ENCOUNTER — Encounter (INDEPENDENT_AMBULATORY_CARE_PROVIDER_SITE_OTHER): Payer: Self-pay | Admitting: Gastroenterology

## 2023-09-19 ENCOUNTER — Ambulatory Visit (INDEPENDENT_AMBULATORY_CARE_PROVIDER_SITE_OTHER): Admitting: Gastroenterology

## 2023-09-19 VITALS — BP 133/83 | HR 71 | Temp 97.1°F | Ht 65.0 in | Wt 313.2 lb

## 2023-09-19 DIAGNOSIS — D509 Iron deficiency anemia, unspecified: Secondary | ICD-10-CM

## 2023-09-19 DIAGNOSIS — D500 Iron deficiency anemia secondary to blood loss (chronic): Secondary | ICD-10-CM

## 2023-09-19 DIAGNOSIS — K5904 Chronic idiopathic constipation: Secondary | ICD-10-CM | POA: Insufficient documentation

## 2023-09-19 DIAGNOSIS — E66813 Obesity, class 3: Secondary | ICD-10-CM | POA: Insufficient documentation

## 2023-09-19 MED ORDER — PEG 3350-KCL-NA BICARB-NACL 420 G PO SOLR: 4000.0000 mL | Freq: Once | ORAL | 0 refills | Status: AC

## 2023-09-19 NOTE — Patient Instructions (Signed)
 It was very nice to meet you today, as dicussed with will plan for the following :  1) upper endoscopy and colonoscopy

## 2023-09-19 NOTE — Addendum Note (Signed)
 Addended by: JEANELL GRAEME RAMAN on: 09/19/2023 04:19 PM   Modules accepted: Orders

## 2023-09-19 NOTE — Progress Notes (Signed)
 This encounter was created in error - please disregard. Pt had pap in May, doesn't need annual visit. No other concerns. Appointment cancelled.

## 2023-09-19 NOTE — Progress Notes (Signed)
 Kathryn Harris , M.D. Gastroenterology & Hepatology [facility] [address] [place], [place] [address] Primary Care Physician: Shona Norleen PEDLAR, MD [address] [place] [address]  Chief Complaint: Iron  deficiency anemia  History of Present Illness: Kathryn Harris is a 33 y.o. female with class III obesity who presents for evaluation of iron  deficiency anemia  Patient had shortness of breath on 08/2023 where she was diagnosed with COVID.  After she was given steroids at this visit shortness of breath has improved.  Patient has been following with hematology had received 2 unit PRBC.  Appears patient has been anemic since 2012.  Reports occasional heavy menstruation once a month. The patient denies having any nausea, vomiting, fever, chills, hematochezia, melena, hematemesis, abdominal distention, abdominal pain, diarrhea, jaundice, pruritus or weight loss.  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    Past Surgical History: Past Surgical History:  Procedure Laterality Date   EYE SURGERY      Family History: Family History  Problem Relation Age of Onset   Hypertension Father    Diabetes Other     Social History: Social History   Tobacco Use  Smoking Status Never  Smokeless Tobacco Former   Quit date: 07/01/2007   Social History   Substance and Sexual Activity  Alcohol Use Not Currently   Comment: occasionally   Social History   Substance and Sexual Activity  Drug Use Not Currently   Types: Marijuana    Allergies: No Known Allergies  Medications: Current Outpatient Medications  Medication  Sig Dispense Refill   albuterol  (VENTOLIN  HFA) 108 (90 Base) MCG/ACT inhaler Inhale 2 puffs into the lungs every 4 (four) hours as needed. 6.7 g 0   liraglutide (VICTOZA) 18 MG/3ML SOPN Inject 0.6 mg into the skin daily. (Patient not taking: Reported on 09/19/2023)     promethazine -dextromethorphan (PROMETHAZINE -DM) 6.25-15 MG/5ML syrup Take 5 mLs by mouth 4 (four) times daily as needed. (Patient not taking: Reported on 09/19/2023) 118 mL 0   No current facility-administered medications for this visit.    Review of Systems: GENERAL: negative for malaise, night sweats HEENT: No changes in hearing or vision, no nose bleeds or other nasal problems. NECK: Negative for lumps, goiter, pain and significant neck swelling RESPIRATORY: Negative for cough, wheezing CARDIOVASCULAR: Negative for chest pain, leg swelling, palpitations, orthopnea GI: SEE HPI MUSCULOSKELETAL: Negative for joint pain or swelling, back pain, and muscle pain. SKIN: Negative for lesions, rash HEMATOLOGY Negative for prolonged bleeding, bruising easily, and swollen nodes. ENDOCRINE: Negative for cold or heat intolerance, polyuria, polydipsia and goiter. NEURO: negative for tremor, gait imbalance, syncope and seizures. The remainder of the review of systems is noncontributory.   Physical Exam: BP 133/83 (BP Location: Left Arm, Patient Position: Sitting, Cuff Size: Large)   Pulse 71   Temp (!) 97.1 F (36.2 C) (Temporal)   Ht 5' 5 (1.651 m)   Wt (!) 313 lb 3.2 oz (142.1 kg)   LMP 08/22/2023 (Exact Date) Comment: start  BMI 52.12 kg/m  GENERAL: The patient is AO x3, in no acute distress. HEENT: Head is normocephalic and atraumatic. EOMI are intact. Mouth is well hydrated and  without lesions. NECK: Supple. No masses LUNGS: Clear to auscultation. No presence of rhonchi/wheezing/rales. Adequate chest expansion HEART: RRR, normal s1 and s2. ABDOMEN: Soft, nontender, no guarding, no peritoneal signs, and nondistended. BS +.  No masses.  Imaging/Labs: as above     Latest Ref Rng & Units 07/05/2023    8:43 AM 05/08/2023    2:32 PM 06/20/2018   12:08 PM  CBC  WBC 4.0 - 10.5 K/uL 6.8  6.8  7.0   Hemoglobin 12.0 - 15.0 g/dL 88.7  6.0  87.8   Hematocrit 36.0 - 46.0 % 37.4  23.5  40.8   Platelets 150 - 400 K/uL 274  247  308    Lab Results  Component Value Date   IRON  37 07/05/2023   TIBC 373 07/05/2023   FERRITIN 35 07/05/2023    I personally reviewed and interpreted the available labs, imaging and endoscopic files.  Impression and Plan:  Kathryn Harris is a 33 y.o. female with class III obesity who presents for evaluation of iron  deficiency anemia  #Iron  deficiency anemia  Ferritin 35 hemoglobin from 05/2023 was 6 most recent 07/2023 11.2 No overt GI bleeding  Menstruation is heavy occasionally but patient reports usually normal menstruation  As per ACG guidelines , bidirectional endoscopy is recommended over iron  replacement therapy only.  We will proceed along with Upper endoscopy with small bowel biopsies and Colonoscopy   Recommend continue following with hematology  I thoroughly discussed with the patient the procedure, including the risks involved. Patient understands what the procedure involves including the benefits and any risks. Patient understands alternatives to the proposed procedure. Risks including (but not limited to) bleeding, tearing of the lining (perforation), rupture of adjacent organs, problems with heart and lung function, infection, and medication reactions. A small percentage of complications may require surgery, hospitalization, repeat endoscopic procedure, and/or transfusion.  Patient understood and agreed.    #BMI 52      - walking at a brisk pace/biking at moderate intesity 2.5-5 hours per week     - use pedometer/step counter to track activity     - goal to lose 5-10% of initial body weight     - avoid suagry drinks and juices, use zero calorie beverages     -  increase water intake     - eat a low carb diet with plenty of veggies and fruit     - Get sufficient sleep 7-8 hrs nightly     - maitain active lifestyle     - avoid alcohol     - recommend 2-3 cups Coffee daily     - Counsel on lowering cholesterol by having a diet rich in vegetables,          protein (avoid red meats) and good fats(fish, salmon).    All questions were answered.      Dawn Convery Faizan Roselynne Lortz, MD Gastroenterology and Hepatology [facility]enter[facility]   This chart has been completed using [facility] Dictation software, and while attempts have been made to ensure accuracy , certain words and phrases may not be transcribed as intended

## 2023-09-19 NOTE — Telephone Encounter (Signed)
 Pt called back. Scheduled for TCS/EGD with Dr. Cinderella, rm 3 on 10/31. Aware will send instructions and pre-op appt via mychart. Rx for prep will be sent to pharmacy

## 2023-09-19 NOTE — Telephone Encounter (Signed)
 ATC pt, no answer. LVM.

## 2023-09-20 ENCOUNTER — Encounter: Payer: Self-pay | Admitting: *Deleted

## 2023-09-22 ENCOUNTER — Telehealth: Payer: Self-pay

## 2023-09-22 ENCOUNTER — Telehealth: Payer: Self-pay | Admitting: Family Medicine

## 2023-09-22 MED ORDER — ALBUTEROL SULFATE HFA 108 (90 BASE) MCG/ACT IN AERS: 2.0000 | INHALATION_SPRAY | RESPIRATORY_TRACT | 0 refills | Status: DC | PRN

## 2023-09-22 NOTE — Telephone Encounter (Signed)
 Pt called stating she was seen on 09/17/2023, was given an inhaler and cough medicine. Pharmacy had to be switched due to insurance not covering meds at the one chosen pharmacy was switched from [facility] to [facility], pt went to pick up her meds and was told the inhaler would need to be authorized by provider before releasing rx's.   Looking into patients chart inhaler was ordered on 09/17/2023 but discontinued on 09/19/2023. Provider made aware.

## 2023-09-22 NOTE — Telephone Encounter (Signed)
 Albuterol  changed to [facility]

## 2023-09-24 ENCOUNTER — Encounter: Payer: Self-pay | Admitting: Obstetrics & Gynecology

## 2023-10-12 ENCOUNTER — Emergency Department (HOSPITAL_COMMUNITY)

## 2023-10-12 ENCOUNTER — Encounter (HOSPITAL_COMMUNITY): Payer: Self-pay

## 2023-10-12 ENCOUNTER — Emergency Department (HOSPITAL_COMMUNITY)
Admission: EM | Admit: 2023-10-12 | Discharge: 2023-10-12 | Disposition: A | Discharge status: Home / Self Care | Attending: Emergency Medicine | Admitting: Emergency Medicine

## 2023-10-12 DIAGNOSIS — R079 Chest pain, unspecified: Secondary | ICD-10-CM | POA: Diagnosis not present

## 2023-10-12 DIAGNOSIS — J4521 Mild intermittent asthma with (acute) exacerbation: Secondary | ICD-10-CM | POA: Diagnosis not present

## 2023-10-12 DIAGNOSIS — R0602 Shortness of breath: Secondary | ICD-10-CM | POA: Diagnosis not present

## 2023-10-12 LAB — CBC WITH DIFFERENTIAL/PLATELET
Abs Immature Granulocytes: 0.02 K/uL (ref 0.00–0.07)
Basophils Absolute: 0 K/uL (ref 0.0–0.1)
Basophils Relative: 0 %
Eosinophils Absolute: 0.3 K/uL (ref 0.0–0.5)
Eosinophils Relative: 3 %
HCT: 36.9 % (ref 36.0–46.0)
Hemoglobin: 11.2 g/dL — ABNORMAL LOW (ref 12.0–15.0)
Immature Granulocytes: 0 %
Lymphocytes Relative: 24 %
Lymphs Abs: 1.8 K/uL (ref 0.7–4.0)
MCH: 23 pg — ABNORMAL LOW (ref 26.0–34.0)
MCHC: 30.4 g/dL (ref 30.0–36.0)
MCV: 75.6 fL — ABNORMAL LOW (ref 80.0–100.0)
Monocytes Absolute: 0.5 K/uL (ref 0.1–1.0)
Monocytes Relative: 6 %
Neutro Abs: 4.9 K/uL (ref 1.7–7.7)
Neutrophils Relative %: 67 %
Platelets: 307 K/uL (ref 150–400)
RBC: 4.88 MIL/uL (ref 3.87–5.11)
RDW: 17.9 % — ABNORMAL HIGH (ref 11.5–15.5)
WBC: 7.4 K/uL (ref 4.0–10.5)
nRBC: 0 % (ref 0.0–0.2)

## 2023-10-12 LAB — COMPREHENSIVE METABOLIC PANEL WITH GFR
ALT: 12 U/L (ref 0–44)
AST: 16 U/L (ref 15–41)
Albumin: 4 g/dL (ref 3.5–5.0)
Alkaline Phosphatase: 94 U/L (ref 38–126)
Anion gap: 11 (ref 5–15)
BUN: 10 mg/dL (ref 6–20)
CO2: 26 mmol/L (ref 22–32)
Calcium: 9.1 mg/dL (ref 8.9–10.3)
Chloride: 106 mmol/L (ref 98–111)
Creatinine, Ser: 0.76 mg/dL (ref 0.44–1.00)
GFR, Estimated: >60 mL/min (ref >60)
Glucose, Bld: 93 mg/dL (ref 70–99)
Potassium: 3.9 mmol/L (ref 3.5–5.1)
Sodium: 143 mmol/L (ref 135–145)
Total Bilirubin: <0.2 mg/dL (ref 0.0–1.2)
Total Protein: 7.2 g/dL (ref 6.5–8.1)

## 2023-10-12 LAB — MAGNESIUM: Magnesium: 2.1 mg/dL (ref 1.7–2.4)

## 2023-10-12 LAB — TROPONIN T, HIGH SENSITIVITY
Troponin T High Sensitivity: <15 ng/L (ref 0–19)
Troponin T High Sensitivity: <15 ng/L (ref 0–19)

## 2023-10-12 MED ORDER — IPRATROPIUM-ALBUTEROL 0.5-2.5 (3) MG/3ML IN SOLN
3.0000 mL | Freq: Once | RESPIRATORY_TRACT | Status: AC
  Administered 2023-10-12: 3 mL via RESPIRATORY_TRACT
  Filled 2023-10-12: qty 3

## 2023-10-12 MED ORDER — PREDNISONE 50 MG PO TABS: 50.0000 mg | ORAL_TABLET | Freq: Every day | ORAL | 0 refills | Status: AC

## 2023-10-12 MED ORDER — METHYLPREDNISOLONE SODIUM SUCC 125 MG IJ SOLR
125.0000 mg | Freq: Once | INTRAMUSCULAR | Status: AC
Start: 2023-10-12 — End: 2023-10-12
  Administered 2023-10-12: 125 mg via INTRAMUSCULAR
  Filled 2023-10-12: qty 2

## 2023-10-12 NOTE — ED Provider Notes (Signed)
 [place] EMERGENCY DEPARTMENT AT [facility] Provider Note  CSN: [id number] Arrival date & time: 10/12/23 1942  Chief Complaint(s) Chest Pain and Shortness of Breath  HPI Kathryn Harris is a 33 y.o. female history of asthma presenting to the emergency department with shortness of breath.  Patient reports shortness of breath intermittently since August.  Reports has gone to urgent care and got a shot of steroid which helps, but symptoms recur.  Has not seen her primary doctor for this.  Reports dry cough, no productive cough.  Also having some chest tightness.  No syncope.  Denies any hormone use, recent travel, surgeries, history of DVT or PE.  Reports some wheezing as well.  No sore throat or runny nose.   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 Authorizing Provider  predniSONE  (DELTASONE ) 50 MG tablet Take 1 tablet (50 mg total) by mouth daily with breakfast for 5 days. 10/12/23 10/17/23 Yes Francesca Elsie CROME, MD  albuterol  (VENTOLIN  HFA) 108 (90 Base) MCG/ACT inhaler Inhale 2 puffs into the lungs every 4 (four) hours as needed. 01/11/20   Knapp, Iva, MD  albuterol  (VENTOLIN  HFA) 108 (90 Base) MCG/ACT inhaler Inhale 2 puffs into the lungs every 4 (four) hours as needed. 09/22/23   Stuart Vernell Norris, PA-C  liraglutide (VICTOZA) 18 MG/3ML SOPN Inject 0.6 mg into the skin daily. Patient not taking: Reported on 09/19/2023 09/17/23 10/17/23  [provider]  promethazine -dextromethorphan  (PROMETHAZINE -DM) 6.25-15 MG/5ML syrup Take 5 mLs by mouth 4 (four) times daily as needed. Patient not taking: Reported on 09/19/2023 08/27/23   Leath-Warren, Etta PARAS, NP                                                                                                                                    Past Surgical History Past Surgical History:  Procedure Laterality Date   EYE SURGERY     Family History Family History  Problem Relation Age of Onset   Hypertension Father    Diabetes Other     Social History Social History   Tobacco Use   Smoking status: Never   Smokeless tobacco: Former    Quit date: 07/01/2007  Vaping Use   Vaping status: Never Used  Substance Use Topics   Alcohol use: Not Currently    Comment: occasionally   Drug use: Not Currently    Types: Marijuana   Allergies Patient has no known  allergies.  Review of Systems Review of Systems  All other systems reviewed and are negative.   Physical Exam Vital Signs  I have reviewed the triage vital signs BP 120/77   Pulse 81   Temp 98 F (36.7 C) (Oral)   Resp 20   Ht 5' 5 (1.651 m)   Wt (!) 140.6 kg   SpO2 95%   BMI 51.59 kg/m  Physical Exam Vitals and nursing note reviewed.  Constitutional:      General: She is not in acute distress.    Appearance: She is well-developed. She is obese.  HENT:     Head: Normocephalic and atraumatic.     Mouth/Throat:     Mouth: Mucous membranes are moist.  Eyes:     Pupils: Pupils are equal, round, and reactive to light.  Cardiovascular:     Rate and Rhythm: Normal rate and regular rhythm.     Heart sounds: No murmur heard. Pulmonary:     Effort: Pulmonary effort is normal. No respiratory distress.     Breath sounds: Wheezing present.  Abdominal:     General: Abdomen is flat.     Palpations: Abdomen is soft.     Tenderness: There is no abdominal tenderness.  Musculoskeletal:        General: No tenderness.     Right lower leg: No edema.     Left lower  leg: No edema.  Skin:    General: Skin is warm and dry.  Neurological:     General: No focal deficit present.     Mental Status: She is alert. Mental status is at baseline.  Psychiatric:        Mood and Affect: Mood normal.        Behavior: Behavior normal.     ED Results and Treatments Labs (all labs ordered are listed, but only abnormal results are displayed) Labs Reviewed  CBC WITH DIFFERENTIAL/PLATELET - Abnormal; Notable for the following components:      Result Value   Hemoglobin 11.2 (*)    MCV 75.6 (*)    MCH 23.0 (*)    RDW 17.9 (*)    All other components within normal limits  COMPREHENSIVE METABOLIC PANEL WITH GFR  MAGNESIUM  TROPONIN T, HIGH SENSITIVITY  TROPONIN T, HIGH SENSITIVITY                                                                                                                          Radiology DG Chest Portable 1 View Result Date: 10/12/2023 CLINICAL DATA:  Shortness of breath, chest pain EXAM: PORTABLE CHEST 1 VIEW COMPARISON:  10/07/2018 FINDINGS: The heart size and mediastinal contours are within normal limits. Both lungs are clear. The visualized skeletal structures are unremarkable. IMPRESSION: No active disease. Electronically Signed   By: Franky Crease M.D.   On: 10/12/2023 20:09    Pertinent labs & imaging results that were available during my care of the patient were reviewed by me  and considered in my medical decision making (see MDM for details).  Medications Ordered in ED Medications  ipratropium-albuterol  (DUONEB) 0.5-2.5 (3) MG/3ML nebulizer solution 3 mL (3 mLs Nebulization Given 10/12/23 2320)  methylPREDNISolone  sodium succinate (SOLU-MEDROL ) 125 mg/2 mL injection 125 mg (125 mg Intramuscular Given 10/12/23 2254)                                                                                                                                     Procedures Procedures  (including critical care time)  Medical Decision Making /  ED Course   MDM:  33 year old presenting to the emergency department with shortness of breath.  Suspect symptoms likely due to underlying asthma.  She does have diffuse wheeze.  She has history of asthma.  Chest x-ray obtained without evidence of underlying process.  Troponin was ordered by nursing and is negative.  EKG is reassuring.  Considered PE, patient PERC negative.  No evidence of other process such as pulmonary edema, pneumonia, CHF, pneumothorax given DuoNeb with improvement.  Will give steroid prescription.  Advise close follow-up with her primary physician.  Will discharge patient to home. All questions answered. Patient comfortable with plan of discharge. Return precautions discussed with patient and specified on the after visit summary.       Additional history obtained:  -External records from outside source obtained and reviewed including: Chart review including previous notes, labs, imaging, consultation notes including prior UC notes    Lab Tests: -I ordered, reviewed, and interpreted labs.   The pertinent results include:   Labs Reviewed  CBC WITH DIFFERENTIAL/PLATELET - Abnormal; Notable for the following components:      Result Value   Hemoglobin 11.2 (*)    MCV 75.6 (*)    MCH 23.0 (*)    RDW 17.9 (*)    All other components within normal limits  COMPREHENSIVE METABOLIC PANEL WITH GFR  MAGNESIUM  TROPONIN T, HIGH SENSITIVITY  TROPONIN T, HIGH SENSITIVITY    Notable for mild anemia   EKG   EKG Interpretation Date/Time:  Friday October 12 2023 19:53:42 EDT Ventricular Rate:  85 PR Interval:  176 QRS Duration:  74 QT Interval:  358 QTC Calculation: 426 R Axis:   47  Text Interpretation: Normal sinus rhythm Normal ECG When compared with ECG of 20-Jun-2018 11:33, No significant change was found Confirmed by Francesca Fallow ([id number]) on 10/12/2023 11:41:00 PM         Imaging Studies ordered: I ordered imaging studies including CXR On my  interpretation imaging demonstrates no acute process I independently visualized and interpreted imaging. I agree with the radiologist interpretation   Medicines ordered and prescription drug management: Meds ordered this encounter  Medications   ipratropium-albuterol  (DUONEB) 0.5-2.5 (3) MG/3ML nebulizer solution 3 mL   methylPREDNISolone  sodium succinate (SOLU-MEDROL ) 125 mg/2 mL injection 125 mg   predniSONE  (DELTASONE ) 50 MG tablet    Sig: Take 1  tablet (50 mg total) by mouth daily with breakfast for 5 days.    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    Final Clinical Impression(s) / ED Diagnoses Final diagnoses:  Mild intermittent asthma with acute exacerbation     This chart was dictated using voice recognition software.  Despite best efforts to proofread,  errors can occur which can change the documentation meaning.    Francesca Elsie CROME, MD 10/12/23 [phone]

## 2023-10-12 NOTE — ED Triage Notes (Signed)
 Pt comes in for CP and SOB. Pt had covid back in 08/2023 and ever since she has been having this problem. Pt has appointment at [facility] tomorrow but could not wait that long. Pt states pain is worse over time. A&Ox4.    Pt does have inhaler at home but does not help

## 2023-10-12 NOTE — Discharge Instructions (Addendum)
 We evaluated you for your shortness of breath.  We suspect that your symptoms are due to an exacerbation of asthma.  Your testing in the emergency department including a chest x-ray was reassuring.  Please continue to use your inhaler as prescribed.  We have prescribed you a course of steroids.  Please follow-up with your primary doctor.  If you have any new or worsening symptoms such as worsening shortness of breath, please return to the emergency department.

## 2023-10-13 ENCOUNTER — Ambulatory Visit: Payer: Self-pay

## 2023-10-16 DIAGNOSIS — Z6841 Body Mass Index (BMI) 40.0 and over, adult: Secondary | ICD-10-CM | POA: Diagnosis not present

## 2023-10-16 DIAGNOSIS — Z713 Dietary counseling and surveillance: Secondary | ICD-10-CM | POA: Diagnosis not present

## 2023-10-16 DIAGNOSIS — E66813 Obesity, class 3: Secondary | ICD-10-CM | POA: Diagnosis not present

## 2023-10-30 ENCOUNTER — Encounter (HOSPITAL_COMMUNITY): Payer: Self-pay

## 2023-10-30 ENCOUNTER — Encounter (HOSPITAL_COMMUNITY)
Admission: RE | Admit: 2023-10-30 | Discharge: 2023-10-30 | Disposition: A | Discharge status: Home / Self Care | Source: Ambulatory Visit | Attending: Gastroenterology | Admitting: Gastroenterology

## 2023-10-30 DIAGNOSIS — Z01818 Encounter for other preprocedural examination: Secondary | ICD-10-CM

## 2023-10-31 DIAGNOSIS — E66813 Obesity, class 3: Secondary | ICD-10-CM | POA: Diagnosis not present

## 2023-10-31 DIAGNOSIS — D649 Anemia, unspecified: Secondary | ICD-10-CM | POA: Diagnosis not present

## 2023-10-31 DIAGNOSIS — Z6841 Body Mass Index (BMI) 40.0 and over, adult: Secondary | ICD-10-CM | POA: Diagnosis not present

## 2023-10-31 DIAGNOSIS — G932 Benign intracranial hypertension: Secondary | ICD-10-CM | POA: Diagnosis not present

## 2023-11-02 ENCOUNTER — Encounter (HOSPITAL_COMMUNITY): Payer: Self-pay | Admitting: Gastroenterology

## 2023-11-02 ENCOUNTER — Other Ambulatory Visit: Payer: Self-pay

## 2023-11-02 ENCOUNTER — Ambulatory Visit (HOSPITAL_COMMUNITY): Admitting: Certified Registered"

## 2023-11-02 ENCOUNTER — Ambulatory Visit (HOSPITAL_COMMUNITY)
Admission: RE | Admit: 2023-11-02 | Discharge: 2023-11-02 | Disposition: A | Discharge status: Home / Self Care | Attending: Gastroenterology | Admitting: Gastroenterology

## 2023-11-02 ENCOUNTER — Encounter (HOSPITAL_COMMUNITY): Admission: RE | Disposition: A | Payer: Self-pay | Source: Home / Self Care | Attending: Gastroenterology

## 2023-11-02 DIAGNOSIS — K648 Other hemorrhoids: Secondary | ICD-10-CM | POA: Diagnosis not present

## 2023-11-02 DIAGNOSIS — K640 First degree hemorrhoids: Secondary | ICD-10-CM

## 2023-11-02 DIAGNOSIS — K3189 Other diseases of stomach and duodenum: Secondary | ICD-10-CM | POA: Insufficient documentation

## 2023-11-02 DIAGNOSIS — Z7951 Long term (current) use of inhaled steroids: Secondary | ICD-10-CM | POA: Diagnosis not present

## 2023-11-02 DIAGNOSIS — K2970 Gastritis, unspecified, without bleeding: Secondary | ICD-10-CM

## 2023-11-02 DIAGNOSIS — K6389 Other specified diseases of intestine: Secondary | ICD-10-CM | POA: Diagnosis not present

## 2023-11-02 DIAGNOSIS — D509 Iron deficiency anemia, unspecified: Secondary | ICD-10-CM | POA: Insufficient documentation

## 2023-11-02 DIAGNOSIS — D72820 Lymphocytosis (symptomatic): Secondary | ICD-10-CM | POA: Insufficient documentation

## 2023-11-02 DIAGNOSIS — J45909 Unspecified asthma, uncomplicated: Secondary | ICD-10-CM | POA: Insufficient documentation

## 2023-11-02 DIAGNOSIS — E66813 Obesity, class 3: Secondary | ICD-10-CM | POA: Diagnosis not present

## 2023-11-02 DIAGNOSIS — Z87891 Personal history of nicotine dependence: Secondary | ICD-10-CM | POA: Diagnosis not present

## 2023-11-02 DIAGNOSIS — Z79899 Other long term (current) drug therapy: Secondary | ICD-10-CM | POA: Insufficient documentation

## 2023-11-02 DIAGNOSIS — Z01818 Encounter for other preprocedural examination: Secondary | ICD-10-CM

## 2023-11-02 DIAGNOSIS — Z6841 Body Mass Index (BMI) 40.0 and over, adult: Secondary | ICD-10-CM | POA: Diagnosis not present

## 2023-11-02 HISTORY — PX: COLONOSCOPY: SHX5424

## 2023-11-02 HISTORY — PX: ESOPHAGOGASTRODUODENOSCOPY: SHX5428

## 2023-11-02 LAB — POCT PREGNANCY, URINE: Preg Test, Ur: NEGATIVE

## 2023-11-02 LAB — HM COLONOSCOPY

## 2023-11-02 MED ORDER — METOCLOPRAMIDE HCL 5 MG/ML IJ SOLN: 10.0000 mg | Freq: Once | INTRAMUSCULAR | Status: AC

## 2023-11-02 MED ORDER — PROPOFOL 10 MG/ML IV BOLUS
INTRAVENOUS | Status: DC | PRN
  Administered 2023-11-02: 130 mg via INTRAVENOUS
  Administered 2023-11-02: 200 ug/kg/min via INTRAVENOUS

## 2023-11-02 MED ORDER — LIDOCAINE HCL (CARDIAC) PF 100 MG/5ML IV SOSY
PREFILLED_SYRINGE | INTRAVENOUS | Status: DC | PRN
  Administered 2023-11-02: 50 mg via INTRAVENOUS

## 2023-11-02 MED ORDER — PHENYLEPHRINE HCL (PRESSORS) 10 MG/ML IV SOLN
INTRAVENOUS | Status: DC | PRN
  Administered 2023-11-02: 100 ug via INTRAVENOUS

## 2023-11-02 MED ORDER — GLYCOPYRROLATE 0.2 MG/ML IJ SOLN
INTRAMUSCULAR | Status: DC | PRN
  Administered 2023-11-02: .2 mg via INTRAVENOUS

## 2023-11-02 MED ORDER — LACTATED RINGERS IV SOLN
INTRAVENOUS | Status: DC
Start: 2023-11-02 — End: 2023-11-02

## 2023-11-02 MED ORDER — IPRATROPIUM-ALBUTEROL 0.5-2.5 (3) MG/3ML IN SOLN
3.0000 mL | Freq: Once | RESPIRATORY_TRACT | Status: AC
Start: 2023-11-02 — End: 2023-11-02
  Administered 2023-11-02: 3 mL via RESPIRATORY_TRACT
  Filled 2023-11-02: qty 3

## 2023-11-02 MED ORDER — METOCLOPRAMIDE HCL 5 MG/ML IJ SOLN
INTRAMUSCULAR | Status: AC
  Administered 2023-11-02: 10 mg via INTRAVENOUS
  Filled 2023-11-02: qty 2

## 2023-11-02 NOTE — H&P (Signed)
 Primary Care Physician:  Shona Norleen PEDLAR, MD Primary Gastroenterologist:  Dr. Cinderella  Pre-Procedure History & Physical: HPI:  Kathryn Harris is a 33 y.o. female with class III obesity who presents for evaluation of iron  deficiency anemia   Patient had shortness of breath on 08/2023 where she was diagnosed with COVID.  After she was given steroids at this visit shortness of breath has improved.  Patient has been following with hematology had received 2 unit PRBC.  Appears patient has been anemic since 2012.  Reports occasional heavy menstruation once a month. The patient denies having any nausea, vomiting, fever, chills, hematochezia, melena, hematemesis, abdominal distention, abdominal pain, diarrhea, jaundice, pruritus or weight loss.   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 Authorizing Provider  albuterol  (VENTOLIN  HFA) 108 (90 Base) MCG/ACT inhaler Inhale 2 puffs into the lungs every 4 (four) hours as needed. 01/11/20   Knapp, Iva, MD  albuterol  (VENTOLIN  HFA) 108 [phone] Base) MCG/ACT inhaler Inhale 2 puffs into the lungs every 4 (four) hours as needed. 09/22/23   Stuart Vernell Norris, PA-C  liraglutide (VICTOZA) 18 MG/3ML SOPN Inject 0.6 mg into the skin daily. 09/17/23 10/30/23  [provider]  promethazine -dextromethorphan (PROMETHAZINE -DM) 6.25-15 MG/5ML syrup Take 5 mLs by mouth 4 (four) times daily as needed. Patient not taking: Reported on 09/19/2023 08/27/23   Leath-Warren, Etta PARAS, NP    Allergies as of 09/19/2023   (No Known  Allergies)    Family History  Problem Relation Age of Onset   Hypertension Father    Diabetes Other     Social History   Socioeconomic History   Marital status: Significant Other    Spouse name: Not on file   Number of children: Not on file   Years of education: Not on file   Highest education level: Not on file  Occupational History   Not on file  Tobacco Use   Smoking status: Never   Smokeless tobacco: Former    Quit date: 07/01/2007  Vaping Use   Vaping status: Never Used  Substance and Sexual Activity   Alcohol use: Not Currently    Comment: occasionally   Drug use: Not Currently    Types: Marijuana   Sexual activity: Yes    Birth control/protection: None  Other Topics Concern   Not on file  Social History Narrative   Not on file   Social Drivers of Health   Financial Resource Strain: Low Risk  (09/18/2023)   Overall Financial Resource Strain (CARDIA)    Difficulty of Paying Living Expenses: Not hard at all  Food Insecurity: No Food Insecurity (09/18/2023)   Hunger Vital Sign    Worried About Running Out of Food in the Last Year: Never true    Ran Out of Food in the Last Year: Never true  Transportation Needs: No Transportation Needs (09/18/2023)   PRAPARE - Administrator, Civil Service (Medical): No    Lack of Transportation (Non-Medical): No  Physical Activity: Insufficiently Active (09/18/2023)   Exercise  Vital Sign    Days of Exercise per Week: 3 days    Minutes of Exercise per Session: 30 min  Stress: No Stress Concern Present (09/18/2023)   [employer] of Occupational Health - Occupational Stress Questionnaire    Feeling of Stress: Not at all  Social Connections: Socially Isolated (09/18/2023)   Social Connection and Isolation Panel    Frequency of Communication with Friends and Family: Once a week    Frequency of Social Gatherings with Friends and Family: Once a week    Attends Religious Services: Never    Database Administrator or  Organizations: No    Attends Banker Meetings: Never    Marital Status: Living with partner  Intimate Partner Violence: Not At Risk (09/18/2023)   Humiliation, Afraid, Rape, and Kick questionnaire    Fear of Current or Ex-Partner: No    Emotionally Abused: No    Physically Abused: No    Sexually Abused: No    Review of Systems: See HPI, otherwise negative ROS  Physical Exam: Vital signs in last 24 hours:     General:   Alert,  Well-developed, well-nourished, pleasant and cooperative in NAD Head:  Normocephalic and atraumatic. Eyes:  Sclera clear, no icterus.   Conjunctiva pink. Ears:  Normal auditory acuity. Nose:  No deformity, discharge,  or lesions. Msk:  Symmetrical without gross deformities. Normal posture. Extremities:  Without clubbing or edema. Neurologic:  Alert and  oriented x4;  grossly normal neurologically. Skin:  Intact without significant lesions or rashes. Psych:  Alert and cooperative. Normal mood and affect.  Impression/Plan: Kathryn Harris is a 33 y.o. female with class III obesity who presents for evaluation of iron  deficiency anemia  Proceed with upper endoscopy and colonoscopy   The risks of the procedure including infection, bleed, or perforation as well as benefits, limitations, alternatives and imponderables have been reviewed with the patient. Questions have been answered. All parties agreeable.

## 2023-11-02 NOTE — Anesthesia Preprocedure Evaluation (Addendum)
 Anesthesia Evaluation  Patient identified by MRN, date of birth, ID band Patient awake    Reviewed: Allergy & Precautions, H&P , NPO status , Patient's Chart, lab work & pertinent test results  Airway Mallampati: II  TM Distance: >3 FB Neck ROM: Full    Dental no notable dental hx.    Pulmonary asthma   Wheezing right upper lobe   + decreased breath sounds+ wheezing      Cardiovascular negative cardio ROS Normal cardiovascular exam Rhythm:Regular Rate:Normal     Neuro/Psych negative neurological ROS  negative psych ROS   GI/Hepatic negative GI ROS, Neg liver ROS,,,  Endo/Other    Class 4 obesityPatient took victoza Sunday Ordered reglan  preop  Renal/GU negative Renal ROS  negative genitourinary   Musculoskeletal negative musculoskeletal ROS (+)    Abdominal   Peds negative pediatric ROS (+)  Hematology  (+) Blood dyscrasia, anemia   Anesthesia Other Findings   Reproductive/Obstetrics negative OB ROS                              Anesthesia Physical Anesthesia Plan  ASA: 3  Anesthesia Plan: General   Post-op Pain Management:    Induction: Intravenous  PONV Risk Score and Plan:   Airway Management Planned: Nasal Cannula  Additional Equipment:   Intra-op Plan:   Post-operative Plan:   Informed Consent: I have reviewed the patients History and Physical, chart, labs and discussed the procedure including the risks, benefits and alternatives for the proposed anesthesia with the patient or authorized representative who has indicated his/her understanding and acceptance.     Dental advisory given  Plan Discussed with: CRNA  Anesthesia Plan Comments:          Anesthesia Quick Evaluation

## 2023-11-02 NOTE — Anesthesia Postprocedure Evaluation (Signed)
 Anesthesia Post Note  Patient: Kathryn Harris  Procedure(s) Performed: COLONOSCOPY EGD (ESOPHAGOGASTRODUODENOSCOPY)  Patient location during evaluation: PACU Anesthesia Type: General Level of consciousness: awake and alert Pain management: pain level controlled Vital Signs Assessment: post-procedure vital signs reviewed and stable Respiratory status: spontaneous breathing, nonlabored ventilation, respiratory function stable and patient connected to nasal cannula oxygen  Cardiovascular status: blood pressure returned to baseline and stable Postop Assessment: no apparent nausea or vomiting Anesthetic complications: no   No notable events documented.   Last Vitals:  Vitals:   11/02/23 1045 11/02/23 1049  BP: (!) 81/64 105/77  Pulse:    Resp:    Temp:    SpO2: 100%     Last Pain:  Vitals:   11/02/23 1045  TempSrc:   PainSc: 0-No pain                 Andrea Limes

## 2023-11-02 NOTE — Discharge Instructions (Signed)

## 2023-11-02 NOTE — Progress Notes (Signed)
  November 02, 2023  Patient: Kathryn Harris  Date of Birth: 1990/06/30  Date of Visit: 09/19/2023    To Whom It May Concern:  Kathryn Harris was seen and treated in our outpatient facility or urgent care center on 11/02/2023. Kathryn Harris  may return to work on 11/05/2023.  Sincerely,    Barnie Lowers, RN

## 2023-11-02 NOTE — Op Note (Signed)
 [facility] Patient Name: Kathryn Harris Procedure Date: 11/02/2023 9:57 AM MRN: [id number] Date of Birth: [birth year] Attending MD: Deatrice Dine , MD, [id number] CSN: [id number] Age: 33 Admit Type: Outpatient Procedure:                Colonoscopy Indications:              Unexplained iron  deficiency anemia Providers:                Deatrice Dine, MD, Madelin Hunter, RN, Jon LABOR.                            Gerome RN, RN Referring MD:              Medicines:                 Complications:            No immediate complications. Estimated Blood Loss:     Estimated blood loss: none. Procedure:                Pre-Anesthesia Assessment:                           - Prior to the procedure, a History and Physical                            was performed, and patient medications and                            allergies were reviewed. The patient's tolerance of                            previous anesthesia was also reviewed. The risks                            and benefits of the procedure and the sedation                            options and risks were discussed with the patient.                            All questions were answered, and informed consent                            was obtained. Prior Anticoagulants: The patient has                            taken no anticoagulant or antiplatelet agents. ASA                            Grade Assessment: III - A patient with severe                            systemic disease. After reviewing the risks and  benefits, the patient was deemed in satisfactory                            condition to undergo the procedure.                           After obtaining informed consent, the colonoscope                            was passed under direct vision. Throughout the                            procedure, the patient's blood pressure, pulse, and                            oxygen  saturations were monitored  continuously. The                            [id number] ([id number]) Colon was introduced through                            the anus and advanced to the the terminal ileum.                            The terminal ileum, ileocecal valve, appendiceal                            orifice, and rectum were photographed. Scope In: 10:27:44 AM Scope Out: 10:37:29 AM Scope Withdrawal Time: 0 hours 7 minutes 10 seconds  Total Procedure Duration: 0 hours 9 minutes 45 seconds  Findings:      The perianal and digital rectal examinations were normal.      The exam was otherwise without abnormality.      Non-bleeding internal hemorrhoids were found during retroflexion. The       hemorrhoids were small.      The terminal ileum appeared normal. Impression:               - The examination was otherwise normal.                           - Non-bleeding internal hemorrhoids.                           - The examined portion of the ileum was normal.                           - No specimens collected. Moderate Sedation:      Per Anesthesia Care Recommendation:           - Patient has a contact number available for                            emergencies. The signs and symptoms of potential                            delayed complications  were discussed with the                            patient. Return to normal activities tomorrow.                            Written discharge instructions were provided to the                            patient.                           - Resume previous diet.                           - Continue present medications.                           - Repeat colonoscopy at age 21 for screening                            purposes.                           - Return to GI office as previously scheduled. Procedure Code(s):        --- Professional ---                           [id number], Colonoscopy, flexible; diagnostic, including                            collection of specimen(s) by  brushing or washing,                            when performed (separate procedure) Diagnosis Code(s):        --- Professional ---                           K64.8, Other hemorrhoids                           D50.9, Iron  deficiency anemia, unspecified CPT copyright 2022 American Medical Association. All rights reserved. The codes documented in this report are preliminary and upon coder review may  be revised to meet current compliance requirements. Deatrice Dine, MD Deatrice Dine, MD 11/02/2023 10:42:55 AM This report has been signed electronically. Number of Addenda: 0

## 2023-11-02 NOTE — Op Note (Addendum)
 [facility] Patient Name: Kathryn Harris Procedure Date: 11/02/2023 9:55 AM MRN: [id number] Date of Birth: 22-Nov-1990 Attending MD: Deatrice Dine , MD, [id number] CSN: [id number] Age: 33 Admit Type: Outpatient Procedure:                Upper GI endoscopy Indications:              Unexplained iron  deficiency anemia Providers:                Deatrice Dine, MD, Madelin Hunter, RN, Jon LABOR.                            Gerome RN, RN Referring MD:              Medicines:                Monitored Anesthesia Care Complications:            No immediate complications. Estimated Blood Loss:     Estimated blood loss was minimal. Procedure:                Pre-Anesthesia Assessment:                           - Prior to the procedure, a History and Physical                            was performed, and patient medications and                            allergies were reviewed. The patient's tolerance of                            previous anesthesia was also reviewed. The risks                            and benefits of the procedure and the sedation                            options and risks were discussed with the patient.                            All questions were answered, and informed consent                            was obtained. Prior Anticoagulants: The patient has                            taken no anticoagulant or antiplatelet agents. ASA                            Grade Assessment: III - A patient with severe                            systemic disease. After reviewing the risks and  benefits, the patient was deemed in satisfactory                            condition to undergo the procedure.                           After obtaining informed consent, the endoscope was                            passed under direct vision. Throughout the                            procedure, the patient's blood pressure, pulse, and                             oxygen  saturations were monitored continuously. The                            [id number] ([id number]) Upper was introduced through                            the mouth, and advanced to the second part of                            duodenum. The upper GI endoscopy was accomplished                            without difficulty. The patient tolerated the                            procedure well. Scope In: 10:17:56 AM Scope Out: 10:21:19 AM Total Procedure Duration: 0 hours 3 minutes 23 seconds  Findings:      The examined esophagus was normal.      Mildly erythematous mucosa without bleeding was found in the stomach.       This was biopsied with a cold forceps for histology.      The duodenal bulb and second portion of the duodenum were normal.       Biopsies were taken with a cold forceps for histology. Impression:               - Normal esophagus.                           - Erythematous mucosa in the stomach. Biopsied.                           - Normal duodenal bulb and second portion of the                            duodenum. Biopsied. Moderate Sedation:      Per Anesthesia Care Recommendation:           - Patient has a contact number available for                            emergencies.  The signs and symptoms of potential                            delayed complications were discussed with the                            patient. Return to normal activities tomorrow.                            Written discharge instructions were provided to the                            patient.                           - Resume previous diet.                           - Continue present medications.                           - Await pathology results. Procedure Code(s):        --- Professional ---                           [phone], Esophagogastroduodenoscopy, flexible,                            transoral; with biopsy, single or multiple Diagnosis Code(s):        --- Professional ---                            K31.89, Other diseases of stomach and duodenum                           D50.9, Iron  deficiency anemia, unspecified CPT copyright 2022 American Medical Association. All rights reserved. The codes documented in this report are preliminary and upon coder review may  be revised to meet current compliance requirements. Deatrice Dine, MD Deatrice Dine, MD 11/02/2023 10:24:58 AM This report has been signed electronically. Number of Addenda: 0

## 2023-11-02 NOTE — Transfer of Care (Signed)
 Immediate Anesthesia Transfer of Care Note  Patient: Kathryn Harris  Procedure(s) Performed: COLONOSCOPY EGD (ESOPHAGOGASTRODUODENOSCOPY)  Patient Location: Short Stay  Anesthesia Type:General  Level of Consciousness: awake, alert , oriented, and patient cooperative  Airway & Oxygen  Therapy: Patient Spontanous Breathing  Post-op Assessment: Report given to RN and Post -op Vital signs reviewed and stable  Post vital signs: Reviewed and stable  Last Vitals:  Vitals Value Taken Time  BP 114/78   Temp 36.2 C 11/02/23 10:40  Pulse 99 11/02/23 10:40  Resp 20 11/02/23 10:40  SpO2 98 % 11/02/23 10:40    Last Pain:  Vitals:   11/02/23 1040  TempSrc: Oral  PainSc:          Complications: No notable events documented.

## 2023-11-05 ENCOUNTER — Encounter (INDEPENDENT_AMBULATORY_CARE_PROVIDER_SITE_OTHER): Payer: Self-pay | Admitting: *Deleted

## 2023-11-05 ENCOUNTER — Encounter (HOSPITAL_COMMUNITY): Payer: Self-pay | Admitting: Gastroenterology

## 2023-11-06 LAB — SURGICAL PATHOLOGY

## 2023-11-07 ENCOUNTER — Ambulatory Visit (INDEPENDENT_AMBULATORY_CARE_PROVIDER_SITE_OTHER): Payer: Self-pay | Admitting: Gastroenterology

## 2023-11-07 ENCOUNTER — Encounter (INDEPENDENT_AMBULATORY_CARE_PROVIDER_SITE_OTHER): Payer: Self-pay | Admitting: Gastroenterology

## 2023-11-07 ENCOUNTER — Encounter (INDEPENDENT_AMBULATORY_CARE_PROVIDER_SITE_OTHER): Payer: Self-pay | Admitting: *Deleted

## 2023-11-07 DIAGNOSIS — D500 Iron deficiency anemia secondary to blood loss (chronic): Secondary | ICD-10-CM

## 2023-11-07 NOTE — Progress Notes (Signed)
 I reviewed the pathology results. Ann, can you send her a letter with the findings as described below please?  Thanks,  Kalya Troeger Kathryn Devlon Dosher, MD Gastroenterology and Hepatology [facility]  ---------------------------------------------------------------------------------------------  [facility] [address], [place], [place] [address] Phone:  [phone]     11/07/23 [place], [place]                                                                          Dear Kathryn Harris,   I am writing to inform you that the biopsies taken during your recent endoscopic examination showed:  A. 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 See note  B. STOMACH, BIOPSY: - Gastric antral and oxyntic mucosa with no specific histopathologic changes - Helicobacter pylori-like organisms are not identified on routine HE stain  COMMENT:  A.   Intraepithelial lymphocytosis and preserved villous architecture can be seen in a variety of conditions besides gluten-sensitive enteropathy, including tropical sprue, food allergy, immune deficiencies (e.g. IgA deficiency, common variable immunodeficiency), H. pylori gastritis, viral enteritis, Giardiasis, blind loop syndrome, Crohn disease, autoimmune enteropathy, drugs (e.g. NSAIDs), lymphocytic and collagenous colitis, and irritable bowel syndrome. Gluten sensitivity and NSAIDs are the most common associations.    What does this mean?   Negative for H .Pylori bacteria   The biopsies showed increase white cells in the epithelium of the duodenal biopsies but there is no damage reported . This is not very concerning but will investigate it further . This can be due to variety of reasons. It can be from medication such as painkillers/NSAIDS and Celiac/Gluten sensitvity   I recommend :   -Avoid using high  dose aspirin including Goody/BC powders, NSAIDs such as Aleve , ibuprofen , naproxen , Motrin , Voltaren or Advil  (even the topical ones)  Continue eating GLUTEN containing diet and DO NOT go Gluten free diet . I would like to do blood work   Blood work :   Check for CVID with total immunoglobulins Will check for celiac serologies .  Also I value your feedback , so if you get a survey , please take the time to fill it out and thank you for choosing [facility]  Please call us  at [phone] if you have persistent problems or have questions about your condition that have not been fully answered at this time.   Sincerely,   Kathryn Harris Kathryn Tifany Hirsch, MD Gastroenterology and Hepatology

## 2023-11-07 NOTE — Telephone Encounter (Signed)
 Please inform patient :  The biopsies showed increase white cells in the epithelium of the duodenal biopsies but there is no damage reported . This is not very concerning but will investigate it further . This can be due to variety of reasons. It can be from medication such as painkillers/NSAIDS and Celiac/Gluten sensitvity    I recommend :    -Avoid using high dose aspirin including Goody/BC powders, NSAIDs such as Aleve , ibuprofen , naproxen , Motrin , Voltaren or Advil  (even the topical ones)   Continue eating GLUTEN containing diet and DO NOT go Gluten free diet . I would like to do blood work    Blood work (ordered)  :    Check for CVID with total immunoglobulins Will check for celiac serologies .

## 2023-11-07 NOTE — Progress Notes (Signed)
 Patient result letter mailed procedure note and pathology result faxed to PCP

## 2023-11-15 ENCOUNTER — Encounter: Payer: Self-pay | Admitting: *Deleted

## 2023-11-15 ENCOUNTER — Inpatient Hospital Stay: Attending: Hematology

## 2023-11-15 DIAGNOSIS — D500 Iron deficiency anemia secondary to blood loss (chronic): Secondary | ICD-10-CM | POA: Insufficient documentation

## 2023-11-15 LAB — CBC
HCT: 39 % (ref 36.0–46.0)
Hemoglobin: 11.8 g/dL — ABNORMAL LOW (ref 12.0–15.0)
MCH: 22.6 pg — ABNORMAL LOW (ref 26.0–34.0)
MCHC: 30.3 g/dL (ref 30.0–36.0)
MCV: 74.7 fL — ABNORMAL LOW (ref 80.0–100.0)
Platelets: 277 K/uL (ref 150–400)
RBC: 5.22 MIL/uL — ABNORMAL HIGH (ref 3.87–5.11)
RDW: 17.2 % — ABNORMAL HIGH (ref 11.5–15.5)
WBC: 6.9 K/uL (ref 4.0–10.5)
nRBC: 0 % (ref 0.0–0.2)

## 2023-11-15 LAB — VITAMIN B12: Vitamin B-12: 479 pg/mL (ref 180–914)

## 2023-11-15 LAB — IRON AND TIBC
Iron: 57 ug/dL (ref 28–170)
Saturation Ratios: 15 % (ref 10.4–31.8)
TIBC: 391 ug/dL (ref 250–450)
UIBC: 334 ug/dL

## 2023-11-15 LAB — FERRITIN: Ferritin: 29 ng/mL (ref 11–307)

## 2023-11-16 DIAGNOSIS — G932 Benign intracranial hypertension: Secondary | ICD-10-CM | POA: Diagnosis not present

## 2023-11-16 DIAGNOSIS — E348 Other specified endocrine disorders: Secondary | ICD-10-CM | POA: Diagnosis not present

## 2023-11-18 LAB — CELIAC AB TTG DGP TIGA
Deamidated Gliadin Abs, IgA: 7 U (ref 0–19)
Deamidated Gliadin Abs, IgG: 2 U (ref 0–19)
Immunoglobulin A, (IgA) QN, Serum: 241 mg/dL (ref 87–352)
t-Transglutaminase (tTG) IgA: <2 U/mL (ref 0–3)
t-Transglutaminase (tTG) IgG: 2 U/mL (ref 0–5)

## 2023-11-18 LAB — IGG, IGA, IGM
IgG (Immunoglobin G), Serum: 1187 mg/dL (ref 586–1602)
IgM (Immunoglobulin M), Srm: 114 mg/dL (ref 26–217)

## 2023-11-19 NOTE — Progress Notes (Signed)
 Normal IgA, IgG and IgM levels; CVID not identified Negative celiac serologies

## 2023-11-20 ENCOUNTER — Encounter: Payer: Self-pay | Admitting: Oncology

## 2023-11-22 ENCOUNTER — Inpatient Hospital Stay (HOSPITAL_BASED_OUTPATIENT_CLINIC_OR_DEPARTMENT_OTHER): Admitting: Oncology

## 2023-11-22 DIAGNOSIS — D500 Iron deficiency anemia secondary to blood loss (chronic): Secondary | ICD-10-CM | POA: Diagnosis not present

## 2023-11-22 DIAGNOSIS — E538 Deficiency of other specified B group vitamins: Secondary | ICD-10-CM

## 2023-11-22 DIAGNOSIS — R0602 Shortness of breath: Secondary | ICD-10-CM | POA: Diagnosis not present

## 2023-11-22 NOTE — Assessment & Plan Note (Addendum)
-  She is still having significant fatigue. -Labs from 11/15/2023 show improvement of her B12 level from 371-479.  -She was given B12 injection on 11/15/2023 while in clinic. -She is taking B12 supplements 1000 mcg daily. - Recheck labs in 4 months.

## 2023-11-22 NOTE — Progress Notes (Signed)
 [facility] OFFICE PROGRESS NOTE  Shona Norleen PEDLAR, MD  ASSESSMENT & PLAN:    I connected with TYECHIA ALLMENDINGER on 11/22/23 at  9:45 AM EST by telephone visit and verified that I am speaking with the correct person using two identifiers.   I discussed the limitations, risks, security and privacy concerns of performing an evaluation and management service by telemedicine and the availability of in-person appointments. I also discussed with the patient that there may be a patient responsible charge related to this service. The patient expressed understanding and agreed to proceed.   Other persons participating in the visit and their role in the encounter: NP, Patient    Patient's location: Home  Provider's location: Clinic   Assessment & Plan Iron  deficiency anemia due to chronic blood loss - Labs from 11/15/2023 show hemoglobin of 11.8 (11.2), MCV 74.7, RDW 17.2 with normal platelet count. Iron  panel shows iron  sats of 15% (10%) and a ferritin of 29 (35).   -She last received INFeD  on 07/16/2023. -Based on labs, would recommend an additional dose of INFeD . -Return to clinic in 4 months with labs a few days before. B12 deficiency -She is still having significant fatigue. -Labs from 11/15/2023 show improvement of her B12 level from 371-479.  -She was given B12 injection on 11/15/2023 while in clinic. -She is taking B12 supplements 1000 mcg daily. - Recheck labs in 4 months. Shortness of breath - Reports a history of asthma. -She is currently using nebulizer twice per day and an albuterol  inhaler 3-4 times per day. -Reports intermittent use over the last several years but persistent he more shortness of breath and wheezing over the last 2 to 3 months. -She is interested in seeing a specialist for this. -Will refer to pulmonology.  Orders Placed This Encounter  Procedures   CBC    Standing Status:   Future    Expected Date:   03/27/2024    Expiration Date:   06/22/2024    Iron  and TIBC ([facility] ONLY)    Standing Status:   Future    Expected Date:   03/27/2024    Expiration Date:   06/22/2024   Ferritin    Standing Status:   Future    Expected Date:   03/27/2024    Expiration Date:   06/22/2024   Vitamin B12    Standing Status:   Future    Expected Date:   03/27/2024    Expiration Date:   06/22/2024   Ambulatory referral to Pulmonology    Referral Priority:   Urgent    Referral Type:   Consultation    Referral Reason:   Specialty Services Required    Requested Specialty:   Pulmonary Disease    Number of Visits Requested:   1    INTERVAL HISTORY: Patient returns for follow-up for iron  and B12 deficiency.  Overall, she feels like she is doing well.  She did notice an increase in energy levels following infusion in June along with B12 shot.  She intermittently takes iron  and B12 supplements.  Denies any melena, hematochezia or bright red blood per rectum.  She had a colonoscopy and upper EGD on 11/02/2023 which showed nonbleeding internal hemorrhoids and erythematous mucosa in the stomach.  Reports increased in trouble breathing over the past 2 to 3 months.  Reports history of asthma she has been seen at an urgent care here recently who said it was an asthma flare.  She was given an  inhaler and a nebulizer treatment.  She is now currently using nebulizer morning and night along with albuterol  inhaler throughout the day.  States her breathing is interfering with her day-to-day activities.  We reviewed CBC, CMP, ferritin, iron  panel and B12.  SUMMARY OF HEMATOLOGIC HISTORY: Oncology History Overview Note  1.  Severe iron  deficiency anemia: - Patient seen at the request of Dr. Laurence. - 05/08/2023: Hb-6, MCV-55.8.  She received 2 units PRBC.  She has been anemic since 2012.  MCV is also low at 60.6 even with hemoglobin 10.6.  MCV was 72 and hemoglobin was 12.1. - She has occasional heavy menses, regular once a month.  Denies BRBPR/melena.   Positive for ice pica. - She is taking liquid iron  which she is tolerating better than she did iron  tablets since May 2025.   2. 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  Non-smoker. - Sister has anemia.  No family history of sickle cell anemia or thalassemia. - Maternal grandmother had pancreatic cancer.  Maternal grandfather had lung cancer.    No history exists.     CBC    Component Value Date/Time   WBC 6.9 11/15/2023 0908   RBC 5.22 (H) 11/15/2023 0908   HGB 11.8 (L) 11/15/2023 0908   HCT 39.0 11/15/2023 0908   PLT 277 11/15/2023 0908   MCV 74.7 (L) 11/15/2023 0908   MCH 22.6 (L) 11/15/2023 0908   MCHC 30.3 11/15/2023 0908   RDW 17.2 (H) 11/15/2023 0908   LYMPHSABS 1.8 10/12/2023 2026   MONOABS 0.5 10/12/2023 2026   EOSABS 0.3 10/12/2023 2026   BASOSABS 0.0 10/12/2023 2026       Latest Ref Rng & Units 10/12/2023    8:26 PM 05/08/2023    2:32 PM 06/20/2018   12:08 PM  CMP  Glucose 70 - 99 mg/dL 93  93  93   BUN 6 - 20 mg/dL 10  12  11    Creatinine 0.44 - 1.00 mg/dL 9.23  9.23  9.25   Sodium 135 - 145 mmol/L 143  135  137   Potassium 3.5 - 5.1 mmol/L 3.9  3.5  4.4   Chloride 98 - 111 mmol/L 106  106  103   CO2 22 - 32 mmol/L 26  21  24    Calcium 8.9 - 10.3 mg/dL 9.1  8.4  8.9   Total Protein 6.5 - 8.1 g/dL 7.2     Total Bilirubin 0.0 - 1.2 mg/dL <9.7     Alkaline Phos 38 - 126 U/L 94     AST 15 - 41 U/L 16     ALT 0 - 44 U/L 12        Lab Results  Component Value Date   FERRITIN 29 11/15/2023   VITAMINB12 479 11/15/2023    There were no vitals filed for this visit.  Review of System:  Review of Systems  Constitutional:  Positive for malaise/fatigue and weight loss.  Respiratory:  Positive for cough and shortness of breath.   Psychiatric/Behavioral:  The patient has insomnia.     Physical Exam: Physical Exam Constitutional:      Appearance: Normal appearance.  HENT:     Head: Normocephalic and atraumatic.  Eyes:      Pupils: Pupils are equal, round, and reactive to light.  Cardiovascular:     Rate and Rhythm: Normal rate and regular rhythm.     Heart sounds: Normal heart sounds. No murmur heard. Pulmonary:  Effort: Pulmonary effort is normal.     Breath sounds: Normal breath sounds. No wheezing.  Abdominal:     General: Bowel sounds are normal. There is no distension.     Palpations: Abdomen is soft.     Tenderness: There is no abdominal tenderness.  Musculoskeletal:        General: Normal range of motion.     Cervical back: Normal range of motion.  Skin:    General: Skin is warm and dry.     Findings: No rash.  Neurological:     Mental Status: She is alert and oriented to person, place, and time.     Gait: Gait is intact.  Psychiatric:        Mood and Affect: Mood and affect normal.        Cognition and Memory: Memory normal.        Judgment: Judgment normal.      I provided 20 minutes of non face-to-face telephone visit time during this encounter, and > 50% was spent counseling as documented under my assessment & plan.   Delon Hope, NP 11/22/2023 10:09 AM

## 2023-11-22 NOTE — Assessment & Plan Note (Addendum)
-   Labs from 11/15/2023 show hemoglobin of 11.8 (11.2), MCV 74.7, RDW 17.2 with normal platelet count. Iron  panel shows iron  sats of 15% (10%) and a ferritin of 29 (35).   -She last received INFeD  on 07/16/2023. -Based on labs, would recommend an additional dose of INFeD . -Return to clinic in 4 months with labs a few days before.

## 2023-11-22 NOTE — Assessment & Plan Note (Addendum)
-   Reports a history of asthma. -She is currently using nebulizer twice per day and an albuterol  inhaler 3-4 times per day. -Reports intermittent use over the last several years but persistent he more shortness of breath and wheezing over the last 2 to 3 months. -She is interested in seeing a specialist for this. -Will refer to pulmonology.

## 2023-11-23 ENCOUNTER — Ambulatory Visit
Admission: RE | Admit: 2023-11-23 | Discharge: 2023-11-23 | Disposition: A | Discharge status: Home / Self Care | Source: Ambulatory Visit | Attending: Nurse Practitioner | Admitting: Nurse Practitioner

## 2023-11-23 VITALS — BP 120/84 | HR 72 | Temp 98.5°F | Resp 18

## 2023-11-23 DIAGNOSIS — J4541 Moderate persistent asthma with (acute) exacerbation: Secondary | ICD-10-CM | POA: Diagnosis not present

## 2023-11-23 DIAGNOSIS — R059 Cough, unspecified: Secondary | ICD-10-CM | POA: Diagnosis not present

## 2023-11-23 MED ORDER — PREDNISONE 50 MG PO TABS: ORAL_TABLET | ORAL | 0 refills | Status: DC

## 2023-11-23 MED ORDER — METHYLPREDNISOLONE SODIUM SUCC 125 MG IJ SOLR
125.0000 mg | Freq: Once | INTRAMUSCULAR | Status: AC
  Administered 2023-11-23: 125 mg via INTRAMUSCULAR

## 2023-11-23 MED ORDER — ALBUTEROL SULFATE HFA 108 (90 BASE) MCG/ACT IN AERS: 2.0000 | INHALATION_SPRAY | Freq: Four times a day (QID) | RESPIRATORY_TRACT | 0 refills | Status: DC | PRN

## 2023-11-23 MED ORDER — IPRATROPIUM-ALBUTEROL 0.5-2.5 (3) MG/3ML IN SOLN
3.0000 mL | Freq: Once | RESPIRATORY_TRACT | Status: AC
  Administered 2023-11-23: 3 mL via RESPIRATORY_TRACT

## 2023-11-23 NOTE — ED Provider Notes (Signed)
 [facility]    CSN: [id number] Arrival date & time: 11/23/23  1526      History   Chief Complaint Chief Complaint  Patient presents with   Cough    Wheezing really bad , shortness of breath, dry cough - Entered by patient    HPI Kathryn Harris is a 33 y.o. female.   The history is provided by the patient.   Patient presents for a 43-month history of cough, wheezing, and shortness of breath.  Patient states over the past week or so, she developed a dry cough.  She states that she has continued to have intermittent persistent wheezing.  Patient has been seen in this clinic and in the emergency department over the course of the past 2 months.  She states that she has been using both her albuterol  inhaler and nebulizer treatment at home.  She denies new symptoms of fever, chills, headache, chest pain, abdominal pain, nausea, vomiting, diarrhea, or rash.  Patient states she is scheduled to see a pulmonologist next month.  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    Patient Active Problem List   Diagnosis Date Noted   B12 deficiency 11/22/2023   Shortness of breath 11/22/2023   Gastritis and gastroduodenitis 11/02/2023   Grade I hemorrhoids 11/02/2023   Obesity, Class III, BMI 40-49.9 (morbid obesity) (HCC) 09/19/2023   Chronic idiopathic constipation 09/19/2023   Iron  deficiency anemia due to chronic blood loss 05/15/2023   RBC microcytosis 05/15/2023    Past Surgical History:  Procedure Laterality Date   COLONOSCOPY N/A 11/02/2023   Procedure: COLONOSCOPY;  Surgeon: Cinderella Deatrice FALCON, MD;  Location: [facility];  Service: Endoscopy;  Laterality: N/A;  10:45AM, ASA 3   ESOPHAGOGASTRODUODENOSCOPY N/A 11/02/2023   Procedure: EGD (ESOPHAGOGASTRODUODENOSCOPY);  Surgeon: Cinderella Deatrice FALCON, MD;  Location: [facility];  Service: Endoscopy;  Laterality: N/A;    EYE SURGERY      OB History     Gravida  0   Para  0   Term  0   Preterm  0   AB  0   Living  0      SAB  0   IAB  0   Ectopic  0   Multiple  0   Live Births  0            Home Medications    Prior to Admission medications   Medication Sig Start Date End Date Taking? Authorizing Provider  albuterol  (VENTOLIN  HFA) 108 (90 Base) MCG/ACT inhaler Inhale 2 puffs into the lungs every 4 (four) hours as needed. 01/11/20   Knapp, Iva, MD  albuterol  (VENTOLIN  HFA) 108 (90 Base) MCG/ACT inhaler Inhale 2 puffs into the lungs every 4 (four) hours as needed. 09/22/23   Stuart Vernell Norris, PA-C  Liraglutide -Weight Management (SAXENDA) 18 MG/3ML SOPN Inject into the skin. 10/16/23   [provider]  promethazine -dextromethorphan (PROMETHAZINE -DM) 6.25-15 MG/5ML syrup Take 5 mLs by mouth 4 (four) times daily as needed. 08/27/23   Leath-Warren, Etta PARAS, NP    Family History Family History  Problem Relation Age of Onset   Hypertension Father    Diabetes Other     Social History Social History   Tobacco Use   Smoking status: Never   Smokeless tobacco: Former    Quit date: 07/01/2007  Vaping Use  Vaping status: Never Used  Substance Use Topics   Alcohol use: Not Currently    Comment: occasionally   Drug use: Not Currently    Types: Marijuana     Allergies   Patient has no known allergies.   Review of Systems Review of Systems Per HPI  Physical Exam Triage Vital Signs ED Triage Vitals  Encounter Vitals Group     BP 11/23/23 1552 120/84     Girls Systolic BP Percentile --      Girls Diastolic BP Percentile --      Boys Systolic BP Percentile --      Boys Diastolic BP Percentile --      Pulse Rate 11/23/23 1552 94     Resp 11/23/23 1552 18     Temp 11/23/23 1552 98.5 F (36.9 C)     Temp Source 11/23/23 1552 Oral     SpO2 11/23/23 1552 96 %     Weight --      Height --      Head Circumference --      Peak Flow --      Pain Score  11/23/23 1554 0     Pain Loc --      Pain Education --      Exclude from Growth Chart --    No data found.  Updated Vital Signs BP 120/84 (BP Location: Right Arm)   Pulse 94   Temp 98.5 F (36.9 C) (Oral)   Resp 18   LMP 10/28/2023 Comment: last day of MP  SpO2 96%   Visual Acuity Right Eye Distance:   Left Eye Distance:   Bilateral Distance:    Right Eye Near:   Left Eye Near:    Bilateral Near:     Physical Exam Vitals and nursing note reviewed.  Constitutional:      Appearance: Normal appearance.  HENT:     Head: Normocephalic.     Right Ear: Tympanic membrane, ear canal and external ear normal.     Left Ear: Tympanic membrane, ear canal and external ear normal.     Mouth/Throat:     Mouth: Mucous membranes are moist.  Eyes:     Extraocular Movements: Extraocular movements intact.     Conjunctiva/sclera: Conjunctivae normal.     Pupils: Pupils are equal, round, and reactive to light.  Cardiovascular:     Rate and Rhythm: Normal rate and regular rhythm.     Pulses: Normal pulses.     Heart sounds: Normal heart sounds.  Pulmonary:     Effort: Pulmonary effort is normal. Prolonged expiration present. No respiratory distress.     Breath sounds: No stridor. Examination of the right-upper field reveals wheezing. Examination of the left-upper field reveals wheezing. Examination of the right-lower field reveals wheezing. Examination of the left-lower field reveals wheezing. Wheezing present. No rhonchi or rales.  Abdominal:     General: Bowel sounds are normal.     Palpations: Abdomen is soft.  Musculoskeletal:     Cervical back: Normal range of motion.  Skin:    General: Skin is warm and dry.  Neurological:     General: No focal deficit present.     Mental Status: She is alert and oriented to person, place, and time.  Psychiatric:        Mood and Affect: Mood normal.        Behavior: Behavior normal.      UC Treatments / Results  Labs (all labs ordered are  listed, but only abnormal results are displayed) Labs Reviewed - No data to display  EKG   Radiology No results found.  Procedures Procedures (including critical care time)  Medications Ordered in UC Medications - No data to display  Initial Impression / Assessment and Plan / UC Course  I have reviewed the triage vital signs and the nursing notes.  Pertinent labs & imaging results that were available during my care of the patient were reviewed by me and considered in my medical decision making (see chart for details).  Patient presents for complaints of wheezing, cough, shortness of breath for the past 2 months.  Patient with underlying history of asthma.  Per review of her chart, she has been seen in this clinic and in the emergency department over the course of the past 2 months.  On initial exam today, she does have wheezing noted throughout.  Solu-Medrol  125 mg IM and DuoNeb were administered with good improvement of patient's symptoms.  Symptoms are consistent with a moderate persistent asthma exacerbation.  Will treat with prednisone  50 mg, and an albuterol  inhaler.  Patient advised to continue nebulizer treatments as ordered.  Supportive care recommendations were provided and discussed with the patient to include fluids, rest, over-the-counter analgesics, use of a humidifier during sleep, avoiding asthma triggers, and to monitor for signs of worsening.  Patient was given strict ER follow-up precautions.  She was also advised to follow-up with pulmonology as scheduled.  Patient was in agreement with this plan of care and verbalizes understanding.  Questions were answered.  Patient stable for discharge.     Final diagnoses:  None   Discharge Instructions   None    ED Prescriptions   None    PDMP not reviewed this encounter.   Gilmer Etta PARAS, NP 11/23/23 1704

## 2023-11-23 NOTE — ED Triage Notes (Signed)
 Pt reports she has wheezing, SHOB, and a cough x 2 months   States inhaler is not helping    Pulm appt next month

## 2023-11-23 NOTE — Discharge Instructions (Signed)
 You were given an injection of Solu-Medrol  125 mg.  Start the prednisone  tomorrow. Take medication as prescribed. Increase fluids and allow for plenty of rest. You may take over-the-counter Tylenol  or ibuprofen  as needed for pain, fever, or general discomfort. Avoid allergy triggers at this time. Recommend the use of a humidifier in your bedroom at nighttime during sleep and sleeping elevated on pillows while symptoms persist. Go to the emergency department if you develop worsening wheezing, shortness of breath, difficulty breathing. Follow-up with pulmonology as scheduled. Follow-up as needed.

## 2023-12-03 ENCOUNTER — Encounter: Payer: Self-pay | Admitting: Oncology

## 2023-12-13 ENCOUNTER — Ambulatory Visit
Admission: RE | Admit: 2023-12-13 | Discharge: 2023-12-13 | Disposition: A | Discharge status: Home / Self Care | Source: Ambulatory Visit | Attending: Nurse Practitioner

## 2023-12-13 VITALS — BP 116/82 | HR 74 | Temp 98.1°F | Resp 18

## 2023-12-13 DIAGNOSIS — J069 Acute upper respiratory infection, unspecified: Secondary | ICD-10-CM | POA: Diagnosis not present

## 2023-12-13 DIAGNOSIS — Z8709 Personal history of other diseases of the respiratory system: Secondary | ICD-10-CM | POA: Diagnosis not present

## 2023-12-13 LAB — POC COVID19/FLU A&B COMBO
Covid Antigen, POC: NEGATIVE
Influenza A Antigen, POC: NEGATIVE
Influenza B Antigen, POC: NEGATIVE

## 2023-12-13 MED ORDER — AZELASTINE HCL 0.1 % NA SOLN: 2.0000 | Freq: Two times a day (BID) | NASAL | 0 refills | Status: DC

## 2023-12-13 MED ORDER — PROMETHAZINE-DM 6.25-15 MG/5ML PO SYRP: 5.0000 mL | ORAL_SOLUTION | Freq: Four times a day (QID) | ORAL | 0 refills | Status: DC | PRN

## 2023-12-13 MED ORDER — ALBUTEROL SULFATE (2.5 MG/3ML) 0.083% IN NEBU: 2.5000 mg | INHALATION_SOLUTION | Freq: Four times a day (QID) | RESPIRATORY_TRACT | 0 refills | Status: DC | PRN

## 2023-12-13 NOTE — ED Provider Notes (Signed)
 [facility]    CSN: [id number] Arrival date & time: 12/13/23  1239      History   Chief Complaint Chief Complaint  Patient presents with   Cough    Wheezing , no taste or smell , bad cough , body aches , shortness of breath - Entered by patient    HPI Kathryn Harris is a 33 y.o. female.   The history is provided by the patient.   Patient presents for a 1 day history of loss of taste and smell, nasal congestion, runny nose, and cough.  She also states that she was wheezing last evening.  Denies fever, headache, ear pain, chest pain, shortness of breath, difficulty breathing, abdominal pain, nausea, vomiting, diarrhea, or rash.  Patient reports underlying history of asthma.  States that she did take Benadryl  at home because that was all she had.  She denies any obvious close sick contacts.  States she is scheduled to see pulmonology next week.  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    Patient Active Problem List   Diagnosis Date Noted   B12 deficiency 11/22/2023   Shortness of breath 11/22/2023   Gastritis and gastroduodenitis 11/02/2023   Grade I hemorrhoids 11/02/2023   Obesity, Class III, BMI 40-49.9 (morbid obesity) (HCC) 09/19/2023   Chronic idiopathic constipation 09/19/2023   Iron  deficiency anemia due to chronic blood loss 05/15/2023   RBC microcytosis 05/15/2023    Past Surgical History:  Procedure Laterality Date   COLONOSCOPY N/A 11/02/2023   Procedure: COLONOSCOPY;  Surgeon: Cinderella Deatrice FALCON, MD;  Location: [facility];  Service: Endoscopy;  Laterality: N/A;  10:45AM, ASA 3   ESOPHAGOGASTRODUODENOSCOPY N/A 11/02/2023   Procedure: EGD (ESOPHAGOGASTRODUODENOSCOPY);  Surgeon: Cinderella Deatrice FALCON, MD;  Location: [facility];  Service: Endoscopy;  Laterality: N/A;   EYE SURGERY      OB History     Gravida  0   Para  0   Term  0    Preterm  0   AB  0   Living  0      SAB  0   IAB  0   Ectopic  0   Multiple  0   Live Births  0            Home Medications    Prior to Admission medications  Medication Sig Start Date End Date Taking? Authorizing Provider  albuterol  (VENTOLIN  HFA) 108 (90 Base) MCG/ACT inhaler Inhale 2 puffs into the lungs every 6 (six) hours as needed. 11/23/23   Leath-Warren, Etta PARAS, NP    Family History Family History  Problem Relation Age of Onset   Hypertension Father    Diabetes Other     Social History Social History[1]   Allergies   Patient has no known allergies.   Review of Systems Review of Systems Per HPI  Physical Exam Triage Vital Signs ED Triage Vitals  Encounter Vitals Group     BP 12/13/23 1307 116/82     Girls Systolic BP Percentile --      Girls Diastolic BP Percentile --      Boys Systolic BP Percentile --      Boys Diastolic BP Percentile --      Pulse Rate 12/13/23 1307 74     Resp 12/13/23 1307 18     Temp 12/13/23 1307 98.1  F (36.7 C)     Temp Source 12/13/23 1307 Oral     SpO2 12/13/23 1307 99 %     Weight --      Height --      Head Circumference --      Peak Flow --      Pain Score 12/13/23 1308 6     Pain Loc --      Pain Education --      Exclude from Growth Chart --    No data found.  Updated Vital Signs BP 116/82 (BP Location: Right Arm)   Pulse 74   Temp 98.1 F (36.7 C) (Oral)   Resp 18   LMP 11/28/2023 (Exact Date) Comment: last day of MP  SpO2 99%   Visual Acuity Right Eye Distance:   Left Eye Distance:   Bilateral Distance:    Right Eye Near:   Left Eye Near:    Bilateral Near:     Physical Exam Vitals and nursing note reviewed.  Constitutional:      General: She is not in acute distress.    Appearance: Normal appearance.  HENT:     Head: Normocephalic.     Right Ear: Tympanic membrane, ear canal and external ear normal.     Left Ear: Tympanic membrane, ear canal and external ear normal.      Nose: Congestion and rhinorrhea present.     Right Turbinates: Enlarged and swollen.     Left Turbinates: Enlarged and swollen.     Right Sinus: No maxillary sinus tenderness or frontal sinus tenderness.     Left Sinus: No maxillary sinus tenderness or frontal sinus tenderness.     Mouth/Throat:     Lips: Pink.     Mouth: Mucous membranes are moist.     Pharynx: Postnasal drip present. No pharyngeal swelling, oropharyngeal exudate, posterior oropharyngeal erythema or uvula swelling.     Comments: Cobblestoning present to posterior oropharynx  Eyes:     Extraocular Movements: Extraocular movements intact.     Conjunctiva/sclera: Conjunctivae normal.     Pupils: Pupils are equal, round, and reactive to light.  Cardiovascular:     Rate and Rhythm: Normal rate and regular rhythm.     Pulses: Normal pulses.     Heart sounds: Normal heart sounds.  Pulmonary:     Effort: Pulmonary effort is normal. No respiratory distress.     Breath sounds: Normal breath sounds. No stridor. No wheezing, rhonchi or rales.  Musculoskeletal:     Cervical back: Normal range of motion.  Lymphadenopathy:     Cervical: No cervical adenopathy.  Skin:    General: Skin is warm and dry.  Neurological:     General: No focal deficit present.     Mental Status: She is alert and oriented to person, place, and time.  Psychiatric:        Mood and Affect: Mood normal.        Behavior: Behavior normal.      UC Treatments / Results  Labs (all labs ordered are listed, but only abnormal results are displayed) Labs Reviewed  POC COVID19/FLU A&B COMBO    EKG   Radiology No results found.  Procedures Procedures (including critical care time)  Medications Ordered in UC Medications - No data to display  Initial Impression / Assessment and Plan / UC Course  I have reviewed the triage vital signs and the nursing notes.  Pertinent labs & imaging results that were available during my  care of the patient  were reviewed by me and considered in my medical decision making (see chart for details).  COVID/flu test was negative.  On exam, the patient's lung sounds are clear throughout, room air sats are at 99%.  The patient is well-appearing, she is in no acute distress, vital signs are stable.  Symptoms are consistent with viral etiology at this time.  Will provide symptomatic treatment with Promethazine  DM for the cough and azelastine nasal spray for nasal congestion or runny nose.  For patient's asthma, albuterol  nebulizer solution was refilled.  Supportive care recommendations were provided and discussed with the patient to include fluids, rest, over-the-counter analgesics, use of normal saline nasal spray, and use of a humidifier during sleep.  Discussed indications with patient regarding follow-up.  Patient advised to attend pulmonology appointment as scheduled.  Patient was in agreement with this plan of care and verbalizes understanding.  All questions were answered.  Patient stable for discharge.  Work note was provided.   Final Clinical Impressions(s) / UC Diagnoses   Final diagnoses:  None   Discharge Instructions   None    ED Prescriptions   None    PDMP not reviewed this encounter.    [1]  Social History Tobacco Use   Smoking status: Never   Smokeless tobacco: Former    Quit date: 07/01/2007  Vaping Use   Vaping status: Never Used  Substance Use Topics   Alcohol use: Not Currently    Comment: occasionally   Drug use: Not Currently    Types: Marijuana     Gilmer Etta PARAS, NP 12/13/23 1339

## 2023-12-13 NOTE — ED Triage Notes (Signed)
 No taste or smell since yesterday.  States she has been wheezing with dry cough and chills and nasal congestion since yesterday

## 2023-12-13 NOTE — Discharge Instructions (Addendum)
 The COVID/flu test was negative. Take medication as prescribed. Increase fluids and allow for plenty of rest. You may take over-the-counter Tylenol  or ibuprofen  as needed for pain, fever, or general discomfort. Recommend the use of normal saline nasal spray throughout the day for nasal congestion or runny nose. For your cough, recommend the use of a humidifier in your bedroom at nighttime during sleep and sleeping elevated on pillows while symptoms persist. Continue use of your albuterol  inhaler or nebulizer treatments as needed. Symptoms should begin to improve over the next 5 to 7 days; however, if symptoms fail to improve, or begin to worsen, you may follow-up in this clinic or with your primary care physician for further evaluation. Follow-up with pulmonology as scheduled. Follow-up as needed.

## 2023-12-14 ENCOUNTER — Inpatient Hospital Stay: Attending: Hematology

## 2023-12-14 ENCOUNTER — Other Ambulatory Visit: Payer: Self-pay

## 2023-12-14 ENCOUNTER — Encounter (HOSPITAL_COMMUNITY): Payer: Self-pay

## 2023-12-14 ENCOUNTER — Emergency Department (HOSPITAL_COMMUNITY)

## 2023-12-14 ENCOUNTER — Observation Stay (HOSPITAL_COMMUNITY)
Admission: EM | Admit: 2023-12-14 | Discharge: 2023-12-16 | DRG: 189 | Disposition: A | Attending: Family Medicine | Admitting: Family Medicine

## 2023-12-14 DIAGNOSIS — E538 Deficiency of other specified B group vitamins: Secondary | ICD-10-CM | POA: Diagnosis present

## 2023-12-14 DIAGNOSIS — J4541 Moderate persistent asthma with (acute) exacerbation: Secondary | ICD-10-CM | POA: Diagnosis not present

## 2023-12-14 DIAGNOSIS — J45901 Unspecified asthma with (acute) exacerbation: Secondary | ICD-10-CM | POA: Diagnosis present

## 2023-12-14 DIAGNOSIS — R0602 Shortness of breath: Secondary | ICD-10-CM | POA: Diagnosis not present

## 2023-12-14 LAB — BASIC METABOLIC PANEL WITH GFR
Anion gap: 13 (ref 5–15)
BUN: 5 mg/dL — ABNORMAL LOW (ref 6–20)
CO2: 22 mmol/L (ref 22–32)
Calcium: 8.8 mg/dL — ABNORMAL LOW (ref 8.9–10.3)
Chloride: 105 mmol/L (ref 98–111)
Creatinine, Ser: 0.68 mg/dL (ref 0.44–1.00)
GFR, Estimated: >60 mL/min (ref >60)
Glucose, Bld: 124 mg/dL — ABNORMAL HIGH (ref 70–99)
Potassium: 3.6 mmol/L (ref 3.5–5.1)
Sodium: 139 mmol/L (ref 135–145)

## 2023-12-14 LAB — RESP PANEL BY RT-PCR (RSV, FLU A&B, COVID)  RVPGX2
Influenza A by PCR: NEGATIVE
Influenza B by PCR: NEGATIVE
Resp Syncytial Virus by PCR: NEGATIVE
SARS Coronavirus 2 by RT PCR: NEGATIVE

## 2023-12-14 LAB — CBC
HCT: 36.8 % (ref 36.0–46.0)
Hemoglobin: 11.4 g/dL — ABNORMAL LOW (ref 12.0–15.0)
MCH: 23.3 pg — ABNORMAL LOW (ref 26.0–34.0)
MCHC: 31 g/dL (ref 30.0–36.0)
MCV: 75.1 fL — ABNORMAL LOW (ref 80.0–100.0)
Platelets: 206 K/uL (ref 150–400)
RBC: 4.9 MIL/uL (ref 3.87–5.11)
RDW: 17.9 % — ABNORMAL HIGH (ref 11.5–15.5)
WBC: 6.3 K/uL (ref 4.0–10.5)
nRBC: 0 % (ref 0.0–0.2)

## 2023-12-14 LAB — GLUCOSE, CAPILLARY: Glucose-Capillary: 182 mg/dL — ABNORMAL HIGH (ref 70–99)

## 2023-12-14 LAB — BLOOD GAS, VENOUS
Acid-Base Excess: 1.2 mmol/L (ref 0.0–2.0)
Bicarbonate: 27.6 mmol/L (ref 20.0–28.0)
Drawn by: 27160
O2 Saturation: 50.3 %
Patient temperature: 36.6
pCO2, Ven: 49 mmHg (ref 44–60)
pH, Ven: 7.36 (ref 7.25–7.43)
pO2, Ven: <31 mmHg — CL (ref 32–45)

## 2023-12-14 LAB — HCG, SERUM, QUALITATIVE: Preg, Serum: NEGATIVE

## 2023-12-14 MED ORDER — MAGNESIUM SULFATE 2 GM/50ML IV SOLN
2.0000 g | Freq: Once | INTRAVENOUS | Status: AC
  Administered 2023-12-14: 2 g via INTRAVENOUS
  Filled 2023-12-14: qty 50

## 2023-12-14 MED ORDER — ACETAMINOPHEN 325 MG PO TABS: 650.0000 mg | ORAL_TABLET | Freq: Four times a day (QID) | ORAL | Status: DC | PRN

## 2023-12-14 MED ORDER — HYDROMORPHONE HCL 1 MG/ML IJ SOLN
0.5000 mg | Freq: Once | INTRAMUSCULAR | Status: AC
  Administered 2023-12-14: 0.5 mg via INTRAVENOUS
  Filled 2023-12-14: qty 0.5

## 2023-12-14 MED ORDER — POLYETHYLENE GLYCOL 3350 17 G PO PACK: 17.0000 g | PACK | Freq: Every day | ORAL | Status: DC | PRN

## 2023-12-14 MED ORDER — ONDANSETRON HCL 4 MG/2ML IJ SOLN: 4.0000 mg | Freq: Four times a day (QID) | INTRAMUSCULAR | Status: DC | PRN

## 2023-12-14 MED ORDER — SODIUM CHLORIDE 0.9% FLUSH
3.0000 mL | Freq: Two times a day (BID) | INTRAVENOUS | Status: DC
  Administered 2023-12-14 – 2023-12-15 (×3): 3 mL via INTRAVENOUS

## 2023-12-14 MED ORDER — DM-GUAIFENESIN ER 30-600 MG PO TB12
1.0000 | ORAL_TABLET | Freq: Two times a day (BID) | ORAL | Status: DC
  Administered 2023-12-14 – 2023-12-16 (×5): 1 via ORAL
  Filled 2023-12-14 (×5): qty 1

## 2023-12-14 MED ORDER — ALBUTEROL SULFATE (2.5 MG/3ML) 0.083% IN NEBU
INHALATION_SOLUTION | RESPIRATORY_TRACT | Status: AC
  Administered 2023-12-14: 10 mg
  Filled 2023-12-14: qty 12

## 2023-12-14 MED ORDER — BISACODYL 10 MG RE SUPP: 10.0000 mg | Freq: Every day | RECTAL | Status: DC | PRN

## 2023-12-14 MED ORDER — METHYLPREDNISOLONE SODIUM SUCC 40 MG IJ SOLR
40.0000 mg | Freq: Two times a day (BID) | INTRAMUSCULAR | Status: DC
  Administered 2023-12-14: 40 mg via INTRAVENOUS
  Filled 2023-12-14: qty 1

## 2023-12-14 MED ORDER — METHYLPREDNISOLONE SODIUM SUCC 125 MG IJ SOLR
125.0000 mg | Freq: Once | INTRAMUSCULAR | Status: AC
  Administered 2023-12-14: 125 mg via INTRAVENOUS
  Filled 2023-12-14: qty 2

## 2023-12-14 MED ORDER — ACETAMINOPHEN 650 MG RE SUPP: 650.0000 mg | Freq: Four times a day (QID) | RECTAL | Status: DC | PRN

## 2023-12-14 MED ORDER — TRAZODONE HCL 50 MG PO TABS: 50.0000 mg | ORAL_TABLET | Freq: Every evening | ORAL | Status: DC | PRN

## 2023-12-14 MED ORDER — ALBUTEROL SULFATE (2.5 MG/3ML) 0.083% IN NEBU
2.5000 mg | INHALATION_SOLUTION | RESPIRATORY_TRACT | Status: DC | PRN
  Administered 2023-12-16: 2.5 mg via RESPIRATORY_TRACT
  Filled 2023-12-14: qty 3

## 2023-12-14 MED ORDER — HYDRALAZINE HCL 20 MG/ML IJ SOLN: 10.0000 mg | Freq: Four times a day (QID) | INTRAMUSCULAR | Status: DC | PRN

## 2023-12-14 MED ORDER — ONDANSETRON HCL 4 MG PO TABS: 4.0000 mg | ORAL_TABLET | Freq: Four times a day (QID) | ORAL | Status: DC | PRN

## 2023-12-14 MED ORDER — SODIUM CHLORIDE 0.9% FLUSH: 3.0000 mL | INTRAVENOUS | Status: DC | PRN

## 2023-12-14 MED ORDER — ALBUTEROL SULFATE (2.5 MG/3ML) 0.083% IN NEBU
2.5000 mg | INHALATION_SOLUTION | Freq: Once | RESPIRATORY_TRACT | Status: AC
  Administered 2023-12-14: 2.5 mg via RESPIRATORY_TRACT
  Filled 2023-12-14: qty 3

## 2023-12-14 MED ORDER — METHYLPREDNISOLONE SODIUM SUCC 125 MG IJ SOLR
62.5000 mg | Freq: Two times a day (BID) | INTRAMUSCULAR | Status: DC
  Administered 2023-12-15 – 2023-12-16 (×3): 62.5 mg via INTRAVENOUS
  Filled 2023-12-14 (×3): qty 2

## 2023-12-14 MED ORDER — IPRATROPIUM-ALBUTEROL 0.5-2.5 (3) MG/3ML IN SOLN
3.0000 mL | Freq: Once | RESPIRATORY_TRACT | Status: AC
  Administered 2023-12-14: 3 mL via RESPIRATORY_TRACT
  Filled 2023-12-14: qty 3

## 2023-12-14 MED ORDER — HEPARIN SODIUM (PORCINE) 5000 UNIT/ML IJ SOLN
5000.0000 [IU] | Freq: Three times a day (TID) | INTRAMUSCULAR | Status: DC
  Administered 2023-12-14 – 2023-12-16 (×5): 5000 [IU] via SUBCUTANEOUS
  Filled 2023-12-14 (×5): qty 1

## 2023-12-14 MED ORDER — IPRATROPIUM-ALBUTEROL 0.5-2.5 (3) MG/3ML IN SOLN
3.0000 mL | Freq: Four times a day (QID) | RESPIRATORY_TRACT | Status: DC
  Administered 2023-12-14 – 2023-12-16 (×8): 3 mL via RESPIRATORY_TRACT
  Filled 2023-12-14 (×7): qty 3

## 2023-12-14 MED ORDER — SODIUM CHLORIDE 0.9 % IV SOLN: INTRAVENOUS | Status: AC | PRN

## 2023-12-14 MED ORDER — PANTOPRAZOLE SODIUM 40 MG PO TBEC
40.0000 mg | DELAYED_RELEASE_TABLET | Freq: Every day | ORAL | Status: DC
  Administered 2023-12-14 – 2023-12-16 (×3): 40 mg via ORAL
  Filled 2023-12-14 (×3): qty 1

## 2023-12-14 NOTE — ED Notes (Signed)
 ED TO INPATIENT HANDOFF REPORT  ED Nurse Name and Phone #: Eleanor, RN  S Name/Age/Gender Kathryn Harris 33 y.o. female Room/Bed: APA11/APA11  Code Status   Code Status: Not on file  Home/SNF/Other Home Patient oriented to: self, place, time, and situation Is this baseline? Yes   Triage Complete: Triage complete  Chief Complaint Asthma attack [J45.901]  Triage Note Pt BIB EMS for [facility]. Pt was seen at [facility] yesterday and prescribed albuterol  solution for her nebulizer. Pt reports no improvement. EMS gave a treatment to the hospital with no improvement. Pt has audible inspiratory and expiratory wheezing.    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   Past Surgical History:  Procedure Laterality Date   COLONOSCOPY N/A 11/02/2023   Procedure: COLONOSCOPY;  Surgeon: Cinderella Deatrice FALCON, MD;  Location: [facility];  Service: Endoscopy;  Laterality: N/A;  10:45AM, ASA 3   ESOPHAGOGASTRODUODENOSCOPY N/A 11/02/2023   Procedure: EGD (ESOPHAGOGASTRODUODENOSCOPY);  Surgeon: Cinderella Deatrice FALCON, MD;  Location: [facility];  Service: Endoscopy;  Laterality: N/A;   EYE SURGERY       A IV Location/Drains/Wounds Patient Lines/Drains/Airways Status     Active Line/Drains/Airways     Name Placement date Placement time Site Days   Peripheral IV 12/14/23 20 G Anterior;Distal;Left;Upper Arm 12/14/23  0715  Arm  less than 1            Intake/Output Last 24 hours  Intake/Output Summary (Last 24  hours) at 12/14/2023 1538 Last data filed at 12/14/2023 0834 Gross per 24 hour  Intake 50 ml  Output --  Net 50 ml    Labs/Imaging Results for orders placed or performed during the hospital encounter of 12/14/23 (from the past 48 hours)  CBC     Status: Abnormal   Collection Time: 12/14/23  7:36 AM  Result Value Ref Range   WBC 6.3 4.0 - 10.5 K/uL   RBC 4.90 3.87 - 5.11 MIL/uL   Hemoglobin 11.4 (L) 12.0 - 15.0 g/dL   HCT 63.1 63.9 - 53.9 %   MCV 75.1 (L) 80.0 - 100.0 fL   MCH 23.3 (L) 26.0 - 34.0 pg   MCHC 31.0 30.0 - 36.0 g/dL   RDW 82.0 (H) 88.4 - 84.4 %   Platelets 206 150 - 400 K/uL   nRBC 0.0 0.0 - 0.2 %    Comment: Performed at [facility], [address] [place], [place] [address]  hCG, serum, qualitative     Status: None   Collection Time: 12/14/23  7:36 AM  Result Value Ref Range   Preg, Serum NEGATIVE NEGATIVE    Comment:        THE SENSITIVITY OF THIS METHODOLOGY IS >10 mIU/mL. Performed at [facility], [address], [place], [place] [address]   Basic metabolic panel     Status: Abnormal   Collection Time: 12/14/23  7:49 AM  Result Value Ref Range   Sodium 139 135 - 145 mmol/L   Potassium  3.6 3.5 - 5.1 mmol/L   Chloride 105 98 - 111 mmol/L   CO2 22 22 - 32 mmol/L   Glucose, Bld 124 (H) 70 - 99 mg/dL    Comment: Glucose reference range applies only to samples taken after fasting for at least 8 hours.   BUN 5 (L) 6 - 20 mg/dL   Creatinine, Ser 9.31 0.44 - 1.00 mg/dL   Calcium 8.8 (L) 8.9 - 10.3 mg/dL   GFR, Estimated >39 >39 mL/min    Comment: (NOTE) Calculated using the CKD-EPI Creatinine Equation (2021)    Anion gap 13 5 - 15    Comment: Performed at [facility], [address] [place], [place] [address]  Blood gas, venous (at [facility])     Status: Abnormal   Collection Time: 12/14/23  8:50 AM  Result Value Ref Range   pH, Ven 7.36 7.25 - 7.43   pCO2, Ven 49 44 - 60 mmHg   pO2, Ven <31 (LL) 32 - 45 mmHg    Comment: CRITICAL RESULT CALLED TO,  READ BACK BY AND VERIFIED WITH: FOWLER,D  AT 9:02AM  ON  12/14/23 BY FESTERMAN,C    Bicarbonate 27.6 20.0 - 28.0 mmol/L   Acid-Base Excess 1.2 0.0 - 2.0 mmol/L   O2 Saturation 50.3 %   Patient temperature 36.6    Collection site BLOOD RIGHT HAND    Drawn by [id number]     Comment: Performed at [facility], [address], [place], [place] [address]  Resp panel by RT-PCR (RSV, Flu A&B, Covid) Anterior Nasal Swab     Status: None   Collection Time: 12/14/23 10:06 AM   Specimen: Anterior Nasal Swab  Result Value Ref Range   SARS Coronavirus 2 by RT PCR NEGATIVE NEGATIVE    Comment: (NOTE) SARS-CoV-2 target nucleic acids are NOT DETECTED.  The SARS-CoV-2 RNA is generally detectable in upper respiratory specimens during the acute phase of infection. The lowest concentration of SARS-CoV-2 viral copies this assay can detect is 138 copies/mL. A negative result does not preclude SARS-Cov-2 infection and should not be used as the sole basis for treatment or other patient management decisions. A negative result may occur with  improper specimen collection/handling, submission of specimen other than nasopharyngeal swab, presence of viral mutation(s) within the areas targeted by this assay, and inadequate number of viral copies(<138 copies/mL). A negative result must be combined with clinical observations, patient history, and epidemiological information. The expected result is Negative.  Fact Sheet for Patients:  bloggercourse.com  Fact Sheet for Healthcare Providers:  seriousbroker.it  This test is no t yet approved or cleared by the [place]  FDA and  has been authorized for detection and/or diagnosis of SARS-CoV-2 by FDA under an Emergency Use Authorization (EUA). This EUA will remain  in effect (meaning this test can be used) for the duration of the COVID-19 declaration under Section 564(b)(1) of the Act, 21 U.S.C.section  360bbb-3(b)(1), unless the authorization is terminated  or revoked sooner.       Influenza A by PCR NEGATIVE NEGATIVE   Influenza B by PCR NEGATIVE NEGATIVE    Comment: (NOTE) The Xpert Xpress SARS-CoV-2/FLU/RSV plus assay is intended as an aid in the diagnosis of influenza from Nasopharyngeal swab specimens and should not be used as a sole basis for treatment. Nasal washings and aspirates are unacceptable for Xpert Xpress SARS-CoV-2/FLU/RSV testing.  Fact Sheet for Patients: bloggercourse.com  Fact Sheet for Healthcare Providers: seriousbroker.it  This test is not yet approved  or cleared by the [place]  FDA and has been authorized for detection and/or diagnosis of SARS-CoV-2 by FDA under an Emergency Use Authorization (EUA). This EUA will remain in effect (meaning this test can be used) for the duration of the COVID-19 declaration under Section 564(b)(1) of the Act, 21 U.S.C. section 360bbb-3(b)(1), unless the authorization is terminated or revoked.     Resp Syncytial Virus by PCR NEGATIVE NEGATIVE    Comment: (NOTE) Fact Sheet for Patients: bloggercourse.com  Fact Sheet for Healthcare Providers: seriousbroker.it  This test is not yet approved or cleared by the [place]  FDA and has been authorized for detection and/or diagnosis of SARS-CoV-2 by FDA under an Emergency Use Authorization (EUA). This EUA will remain in effect (meaning this test can be used) for the duration of the COVID-19 declaration under Section 564(b)(1) of the Act, 21 U.S.C. section 360bbb-3(b)(1), unless the authorization is terminated or revoked.  Performed at [facility], [address], [place], [place] [address]    DG Chest Port 1 View Result Date: 12/14/2023 EXAM: 1 VIEW(S) XRAY OF THE CHEST 12/14/2023 07:27:36 AM COMPARISON: Comparison chest radiographs 10/12/2023 and earlier. CLINICAL  HISTORY: 33 year old female with shortness of breath. FINDINGS: LUNGS AND PLEURA: No confluent lung opacity. No pleural effusion. No pneumothorax. When allowing for portable technique both lungs are clear. HEART AND MEDIASTINUM: No acute abnormality of the cardiac and mediastinal silhouettes. BONES AND SOFT TISSUES: No acute osseous abnormality. IMPRESSION: 1. Negative portable chest. Electronically signed by: Helayne Hurst MD 12/14/2023 07:30 AM EST RP Workstation: [id number]    Pending Labs Unresulted Labs (From admission, onward)    None       Vitals/Pain Today's Vitals   12/14/23 1511 12/14/23 1520 12/14/23 1523 12/14/23 1523  BP:      Pulse:  (!) 119    Resp:  19    Temp:   98.1 F (36.7 C)   TempSrc:   Oral   SpO2: 97% 98%    Weight:      Height:      PainSc:    0-No pain    Isolation Precautions No active isolations  Medications Medications  ipratropium-albuterol  (DUONEB) 0.5-2.5 (3) MG/3ML nebulizer solution 3 mL (3 mLs Nebulization Given 12/14/23 1511)  dextromethorphan-guaiFENesin  (MUCINEX  DM) 30-600 MG per 12 hr tablet 1 tablet (1 tablet Oral Given 12/14/23 1158)  albuterol  (PROVENTIL ) (2.5 MG/3ML) 0.083% nebulizer solution 2.5 mg (has no administration in time range)  methylPREDNISolone  sodium succinate (SOLU-MEDROL ) 40 mg/mL injection 40 mg (has no administration in time range)  methylPREDNISolone  sodium succinate (SOLU-MEDROL ) 125 mg/2 mL injection 125 mg (125 mg Intravenous Given 12/14/23 0734)  magnesium sulfate IVPB 2 g 50 mL (0 g Intravenous Stopped 12/14/23 0834)  ipratropium-albuterol  (DUONEB) 0.5-2.5 (3) MG/3ML nebulizer solution 3 mL (3 mLs Nebulization Given 12/14/23 0729)  albuterol  (PROVENTIL ) (2.5 MG/3ML) 0.083% nebulizer solution 2.5 mg (2.5 mg Nebulization Given 12/14/23 0729)  albuterol  (PROVENTIL ) (2.5 MG/3ML) 0.083% nebulizer solution (10 mg  Given 12/14/23 0748)  HYDROmorphone (DILAUDID) injection 0.5 mg (0.5 mg Intravenous Given 12/14/23 0959)     Mobility walks     Focused Assessments Pulmonary Assessment Handoff:  Lung sounds: Bilateral Breath Sounds: Expiratory wheezes L Breath Sounds: Inspiratory wheezes, Expiratory wheezes R Breath Sounds: Expiratory wheezes, Inspiratory wheezes O2 Device: Nasal Cannula O2 Flow Rate (L/min): 4 L/min    R Recommendations: See Admitting Provider Note  Report given to:   Additional Notes: .      [1] No Known Allergies

## 2023-12-14 NOTE — ED Notes (Signed)
 Pt given ice chips at this time

## 2023-12-14 NOTE — ED Triage Notes (Signed)
 Pt BIB EMS for [facility]. Pt was seen at [facility] yesterday and prescribed albuterol  solution for her nebulizer. Pt reports no improvement. EMS gave a treatment to the hospital with no improvement. Pt has audible inspiratory and expiratory wheezing.

## 2023-12-14 NOTE — ED Provider Notes (Signed)
 [place] EMERGENCY DEPARTMENT AT [facility] Provider Note   CSN: [id number] Arrival date & time: 12/14/23  9341     Patient presents with: Shortness of Breath   Kiyoko R Lahey is a 33 y.o. female.   Patient has a history of asthma.  She presents with wheezing and shortness of breath.  No fevers no chills.  Patient has a history of bronchospasm  The history is provided by the patient and medical records. No language interpreter was used.  Shortness of Breath Severity:  Moderate Onset quality:  Sudden Timing:  Constant Progression:  Worsening Chronicity:  New Context: activity   Relieved by:  Nothing Worsened by:  Nothing Ineffective treatments:  None tried Associated symptoms: wheezing   Associated symptoms: no abdominal pain, no chest pain, no cough, no headaches and no rash        Prior to Admission medications  Medication Sig Start Date End Date Taking? Authorizing Provider  albuterol  (PROVENTIL ) (2.5 MG/3ML) 0.083% nebulizer solution Take 3 mLs (2.5 mg total) by nebulization every 6 (six) hours as needed for wheezing or shortness of breath. 12/13/23   Leath-Warren, Etta PARAS, NP  azelastine  (ASTELIN ) 0.1 % nasal spray Place 2 sprays into both nostrils 2 (two) times daily. Use in each nostril as directed 12/13/23   Leath-Warren, Etta PARAS, NP  promethazine -dextromethorphan  (PROMETHAZINE -DM) 6.25-15 MG/5ML syrup Take 5 mLs by mouth 4 (four) times daily as needed. 12/13/23   Leath-Warren, Etta PARAS, NP    Allergies: Patient has no known allergies.    Review of Systems  Constitutional:  Negative for appetite change and fatigue.  HENT:  Negative for congestion, ear discharge and sinus pressure.   Eyes:  Negative for discharge.  Respiratory:  Positive for shortness of breath and wheezing. Negative for cough.   Cardiovascular:  Negative for chest pain.  Gastrointestinal:  Negative for abdominal pain and diarrhea.  Genitourinary:  Negative for  frequency and hematuria.  Musculoskeletal:  Negative for back pain.  Skin:  Negative for rash.  Neurological:  Negative for seizures and headaches.  Psychiatric/Behavioral:  Negative for hallucinations.     Updated Vital Signs BP 126/86   Pulse (!) 122   Temp 97.9 F (36.6 C) (Oral)   Resp (!) 22   Ht 5' 5 (1.651 m)   Wt (!) 140.6 kg   LMP 12/05/2023 (Exact Date) Comment: last day of MP  SpO2 96%   BMI 51.59 kg/m   Physical Exam Vitals and nursing note reviewed.  Constitutional:      Appearance: She is well-developed.  HENT:     Head: Normocephalic.     Nose: Nose normal.  Eyes:     General: No scleral icterus.    Conjunctiva/sclera: Conjunctivae normal.  Neck:     Thyroid: No thyromegaly.  Cardiovascular:     Rate and Rhythm: Normal rate and regular rhythm.     Heart sounds: No murmur heard.    No friction rub. No gallop.  Pulmonary:     Breath sounds: No stridor. Wheezing present. No rales.  Chest:     Chest wall: No tenderness.  Abdominal:     General: There is no distension.     Tenderness: There is no abdominal tenderness. There is no rebound.  Musculoskeletal:        General: Normal range of motion.     Cervical back: Neck supple.  Lymphadenopathy:     Cervical: No cervical adenopathy.  Skin:    Findings: No erythema  or rash.  Neurological:     Mental Status: She is alert and oriented to person, place, and time.     Motor: No abnormal muscle tone.     Coordination: Coordination normal.  Psychiatric:        Behavior: Behavior normal.     (all labs ordered are listed, but only abnormal results are displayed) Labs Reviewed  CBC - Abnormal; Notable for the following components:      Result Value   Hemoglobin 11.4 (*)    MCV 75.1 (*)    MCH 23.3 (*)    RDW 17.9 (*)    All other components within normal limits  BLOOD GAS, VENOUS - Abnormal; Notable for the following components:   pO2, Ven <31 (*)    All other components within normal limits   BASIC METABOLIC PANEL WITH GFR - Abnormal; Notable for the following components:   Glucose, Bld 124 (*)    BUN 5 (*)    Calcium 8.8 (*)    All other components within normal limits  RESP PANEL BY RT-PCR (RSV, FLU A&B, COVID)  [id number]  HCG, SERUM, QUALITATIVE    EKG: EKG Interpretation Date/Time:  Friday December 14 2023 07:22:46 EST Ventricular Rate:  127 PR Interval:  100 QRS Duration:  85 QT Interval:  377 QTC Calculation: 548 R Axis:   60  Text Interpretation: Sinus tachycardia Atrial premature complex Borderline T abnormalities, diffuse leads Prolonged QT interval Confirmed by Suzette Pac [phone]) on 12/14/2023 9:27:40 AM  Radiology: [facility] Chest Port 1 View Result Date: 12/14/2023 EXAM: 1 VIEW(S) XRAY OF THE CHEST 12/14/2023 07:27:36 AM COMPARISON: Comparison chest radiographs 10/12/2023 and earlier. CLINICAL HISTORY: 33 year old female with shortness of breath. FINDINGS: LUNGS AND PLEURA: No confluent lung opacity. No pleural effusion. No pneumothorax. When allowing for portable technique both lungs are clear. HEART AND MEDIASTINUM: No acute abnormality of the cardiac and mediastinal silhouettes. BONES AND SOFT TISSUES: No acute osseous abnormality. IMPRESSION: 1. Negative portable chest. Electronically signed by: Helayne Hurst MD 12/14/2023 07:30 AM EST RP Workstation: [id number]     Procedures   Medications Ordered in the ED  ipratropium-albuterol  (DUONEB) 0.5-2.5 (3) MG/3ML nebulizer solution 3 mL (has no administration in time range)  dextromethorphan -guaiFENesin  (MUCINEX  DM) 30-600 MG per 12 hr tablet 1 tablet (has no administration in time range)  albuterol  (PROVENTIL ) (2.5 MG/3ML) 0.083% nebulizer solution 2.5 mg (has no administration in time range)  methylPREDNISolone  sodium succinate (SOLU-MEDROL ) 40 mg/mL injection 40 mg (has no administration in time range)  methylPREDNISolone  sodium succinate (SOLU-MEDROL ) 125 mg/2 mL injection 125 mg (125 mg Intravenous Given  12/14/23 0734)  magnesium  sulfate IVPB 2 g 50 mL (0 g Intravenous Stopped 12/14/23 0834)  ipratropium-albuterol  (DUONEB) 0.5-2.5 (3) MG/3ML nebulizer solution 3 mL (3 mLs Nebulization Given 12/14/23 0729)  albuterol  (PROVENTIL ) (2.5 MG/3ML) 0.083% nebulizer solution 2.5 mg (2.5 mg Nebulization Given 12/14/23 0729)  albuterol  (PROVENTIL ) (2.5 MG/3ML) 0.083% nebulizer solution (10 mg  Given 12/14/23 0748)  HYDROmorphone  (DILAUDID ) injection 0.5 mg (0.5 mg Intravenous Given 12/14/23 0959)   CRITICAL CARE Performed by: Pac Suzette Total critical care time: 45 minutes Critical care time was exclusive of separately billable procedures and treating other patients. Critical care was necessary to treat or prevent imminent or life-threatening deterioration. Critical care was time spent personally by me on the following activities: development of treatment plan with patient and/or surrogate as well as nursing, discussions with consultants, evaluation of patient's response to treatment, examination of patient, obtaining history from patient  or surrogate, ordering and performing treatments and interventions, ordering and review of laboratory studies, ordering and review of radiographic studies, pulse oximetry and re-evaluation of patient's condition.                                  Medical Decision Making Amount and/or Complexity of Data Reviewed Labs: ordered. Radiology: ordered.  Risk Prescription drug management. Decision regarding hospitalization.   Patient with asthma exacerbation that has not improved with neb treatments.  She will be admitted to medicine     Final diagnoses:  Moderate persistent asthma with acute exacerbation    ED Discharge Orders     None          Suzette Pac, MD 12/15/23 [phone]

## 2023-12-14 NOTE — H&P (Signed)
 History and Physical    Patient: Kathryn Harris FMW:[id number] DOB: 11-20-1990 DOA: 12/14/2023 DOS: the patient was seen and examined on 12/14/2023 PCP: Shona Norleen PEDLAR, MD  Patient coming from: Home  Chief Complaint:  Chief Complaint  Patient presents with   Shortness of Breath   HPI: Kathryn Harris is a 33 y.o. female who is a non-smoker with medical history significant morbid obesity, asthma, mild chronic anemia, gastritis and duodenitis presents with worsening cough dyspnea and wheezing and is found to be hypoxic in the ED -Patient reports that she was diagnosed with COVID back on 08/27/2023 and respiratory status has not returned to normal since. - Patient reports loss of smell, persistent nasal congestion - Patient was seen in the at Urgent Care  on 12/13/2023 with similar complaints was treated symptomatically apparently COVID and flu test was negative at the time - No vomiting, no diarrhea,, no fevers or chills =-She is very dyspneic and requiring 5 L of oxygen  at rest at this time - No chest pain, no leg pains, no leg swelling or pleuritic symptoms Chest x-ray without acute fine - WBC 6.3 hemoglobin 11.4 platelets 206 -Creatinine 0.68, potassium is 3.6 sodium is 139 - Review of Systems: As mentioned in the history of present illness. All other systems reviewed and are negative. 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 She quit smokeless tobacco use about 16 years ago. She reports that she does not currently use alcohol. She reports that she does not currently use drugs after having used the following drugs: Marijuana.  Allergies[1]  Family History  Problem Relation Age of Onset   Hypertension Father    Diabetes Other     Prior to Admission medications  Medication Sig Start Date End Date Taking? Authorizing Provider  albuterol  (PROVENTIL ) (2.5 MG/3ML) 0.083% nebulizer solution Take 3 mLs (2.5 mg total) by nebulization every 6 (six) hours as needed for wheezing or shortness of breath. 12/13/23  Yes Leath-Warren, Etta PARAS, NP  azelastine (ASTELIN) 0.1 % nasal spray Place 2 sprays into both nostrils 2 (two) times daily. Use in each nostril as directed 12/13/23  Yes Leath-Warren, Etta PARAS, NP  promethazine -dextromethorphan (PROMETHAZINE -DM) 6.25-15 MG/5ML syrup Take 5 mLs by mouth 4 (four) times daily as needed. 12/13/23  Yes Leath-Warren, Etta PARAS, NP    Physical Exam: Vitals:   12/14/23 1730 12/14/23 1745 12/14/23 1810 12/14/23 1941  BP:   131/80   Pulse: 95 98 99   Resp: 20 18 18    Temp:   98.1 F (36.7 C)   TempSrc:   Oral   SpO2: 99% 98% 97% 96%  Weight:      Height:        Physical Exam  Gen:-Morbidly obese, respiratory distress, unable to speak in sentences HEENT:- [place].AT, No sclera icterus Nose- [place] 5L/min Neck-Supple Neck,No JVD,.  Lungs-diminished breath sounds with scattered wheezes bilaterally  CV- S1, S2 normal, RRR Abd-  +ve B.Sounds, Abd Soft, No tenderness, increased truncal adiposity Extremity/Skin:- No  edema,   good pedal pulses  Psych-affect is appropriate, oriented x3 Neuro-no new focal deficits, no tremors  Data Reviewed:   - Patient was seen in the at Urgent Care  on 12/13/2023 with similar complaints was treated symptomatically apparently COVID and flu test was negative at the time COVID, flu and RSV negative again today  12/14/2023 =-Chest x-ray without acute fine - WBC 6.3 hemoglobin 11.4 platelets 206 -Creatinine 0.68, potassium is 3.6 sodium is 139 -  Assessment and Plan: 1) acute asthma exacerbation--patient is a non-smoker - Chest x-ray without acute findings, WBC 6.3 -Patient reports that she was diagnosed with COVID back on 08/27/2023 and respiratory status has not returned to normal since. Patient was seen in the at Urgent Care  on 12/13/2023 with similar complaints was treated symptomatically apparently COVID and flu test was negative at that time -COVID, flu and RSV negative again today 12/14/2023 -- Treat empirically with IV Solu-Medrol , bronchodilators and mucolytics  2) acute hypoxic respiratory failure -- Due to asthma/elevation as above #1 - Management as above #1 -She is very dyspneic and requiring  5 L of oxygen  at rest at this time - No chest pain, no leg pains, no leg swelling or pleuritic symptoms  3)Morbid Obesity- -Low calorie diet, portion control and increase physical activity discussed with patient -Body mass index is 51.59 kg/m.  4) history of gastroduodenitis--continue Protonix while on high-dose steroids  5) history of mild chronic anemia--anemia workup from July 2025 and repeat anemia workup from November 2025 without any evidence of, B12 or folate or iron  deficiency at that time =-No concerns about ongoing bleeding at this time   Advance Care Planning:   Code Status: Full Code   Family Communication: Significant other at bedside, questions answered  Severity of Illness: The appropriate patient status for this patient is OBSERVATION. Observation status is judged to be reasonable and necessary in order to provide the required intensity of service to ensure the patient's safety. The patient's presenting symptoms, physical exam findings, and initial radiographic and laboratory data in the context of their medical condition is felt to place them at decreased risk for further  clinical deterioration. Furthermore, it is anticipated that the patient will be medically stable for discharge from the hospital within 2 midnights of admission.   Author: Rendall Carwin, MD 12/14/2023 8:20 PM  For on call review [email or web address].     [1] No Known Allergies

## 2023-12-14 NOTE — ED Notes (Signed)
 CN asked AC if room was cleaned yet for pt to go up stairs. AC will give CN a call back.

## 2023-12-15 DIAGNOSIS — Z7951 Long term (current) use of inhaled steroids: Secondary | ICD-10-CM | POA: Diagnosis not present

## 2023-12-15 DIAGNOSIS — E66813 Obesity, class 3: Secondary | ICD-10-CM | POA: Diagnosis not present

## 2023-12-15 DIAGNOSIS — D500 Iron deficiency anemia secondary to blood loss (chronic): Secondary | ICD-10-CM | POA: Diagnosis not present

## 2023-12-15 DIAGNOSIS — K2990 Gastroduodenitis, unspecified, without bleeding: Secondary | ICD-10-CM | POA: Diagnosis not present

## 2023-12-15 DIAGNOSIS — J4541 Moderate persistent asthma with (acute) exacerbation: Secondary | ICD-10-CM | POA: Diagnosis not present

## 2023-12-15 DIAGNOSIS — Z87891 Personal history of nicotine dependence: Secondary | ICD-10-CM | POA: Diagnosis not present

## 2023-12-15 DIAGNOSIS — Z833 Family history of diabetes mellitus: Secondary | ICD-10-CM | POA: Diagnosis not present

## 2023-12-15 DIAGNOSIS — J9601 Acute respiratory failure with hypoxia: Secondary | ICD-10-CM | POA: Diagnosis not present

## 2023-12-15 DIAGNOSIS — Z1152 Encounter for screening for COVID-19: Secondary | ICD-10-CM | POA: Diagnosis not present

## 2023-12-15 DIAGNOSIS — Z8616 Personal history of COVID-19: Secondary | ICD-10-CM | POA: Diagnosis not present

## 2023-12-15 DIAGNOSIS — Z8249 Family history of ischemic heart disease and other diseases of the circulatory system: Secondary | ICD-10-CM | POA: Diagnosis not present

## 2023-12-15 DIAGNOSIS — Z6841 Body Mass Index (BMI) 40.0 and over, adult: Secondary | ICD-10-CM | POA: Diagnosis not present

## 2023-12-15 DIAGNOSIS — J4551 Severe persistent asthma with (acute) exacerbation: Secondary | ICD-10-CM | POA: Diagnosis not present

## 2023-12-15 DIAGNOSIS — E538 Deficiency of other specified B group vitamins: Secondary | ICD-10-CM | POA: Diagnosis not present

## 2023-12-15 LAB — GLUCOSE, CAPILLARY
Glucose-Capillary: 130 mg/dL — ABNORMAL HIGH (ref 70–99)
Glucose-Capillary: 140 mg/dL — ABNORMAL HIGH (ref 70–99)
Glucose-Capillary: 160 mg/dL — ABNORMAL HIGH (ref 70–99)
Glucose-Capillary: 165 mg/dL — ABNORMAL HIGH (ref 70–99)

## 2023-12-15 LAB — HIV ANTIBODY (ROUTINE TESTING W REFLEX): HIV Screen 4th Generation wRfx: NONREACTIVE

## 2023-12-15 NOTE — Plan of Care (Signed)

## 2023-12-15 NOTE — Plan of Care (Signed)
°  Problem: Health Behavior/Discharge Planning: Goal: Ability to manage health-related needs will improve Outcome: Progressing   Problem: Education: Goal: Knowledge of General Education information will improve Description: Including pain rating scale, medication(s)/side effects and non-pharmacologic comfort measures Outcome: Progressing   Problem: Clinical Measurements: Goal: Ability to maintain clinical measurements within normal limits will improve Outcome: Progressing Goal: Will remain free from infection Outcome: Progressing Goal: Diagnostic test results will improve Outcome: Progressing Goal: Respiratory complications will improve Outcome: Progressing Goal: Cardiovascular complication will be avoided Outcome: Progressing   Problem: Activity: Goal: Risk for activity intolerance will decrease Outcome: Progressing   Problem: Nutrition: Goal: Adequate nutrition will be maintained Outcome: Progressing   Problem: Coping: Goal: Level of anxiety will decrease Outcome: Progressing   Problem: Elimination: Goal: Will not experience complications related to bowel motility Outcome: Progressing Goal: Will not experience complications related to urinary retention Outcome: Progressing   Problem: Pain Managment: Goal: General experience of comfort will improve and/or be controlled Outcome: Progressing   Problem: Skin Integrity: Goal: Risk for impaired skin integrity will decrease Outcome: Progressing   Problem: Education: Goal: Knowledge of General Education information will improve Description: Including pain rating scale, medication(s)/side effects and non-pharmacologic comfort measures Outcome: Progressing   Problem: Health Behavior/Discharge Planning: Goal: Ability to manage health-related needs will improve Outcome: Progressing   Problem: Clinical Measurements: Goal: Ability to maintain clinical measurements within normal limits will improve Outcome:  Progressing Goal: Will remain free from infection Outcome: Progressing Goal: Diagnostic test results will improve Outcome: Progressing Goal: Respiratory complications will improve Outcome: Progressing Goal: Cardiovascular complication will be avoided Outcome: Progressing   Problem: Activity: Goal: Risk for activity intolerance will decrease Outcome: Progressing   Problem: Nutrition: Goal: Adequate nutrition will be maintained Outcome: Progressing   Problem: Coping: Goal: Level of anxiety will decrease Outcome: Progressing   Problem: Elimination: Goal: Will not experience complications related to bowel motility Outcome: Progressing Goal: Will not experience complications related to urinary retention Outcome: Progressing   Problem: Pain Managment: Goal: General experience of comfort will improve and/or be controlled Outcome: Progressing   Problem: Safety: Goal: Ability to remain free from injury will improve Outcome: Progressing   Problem: Skin Integrity: Goal: Risk for impaired skin integrity will decrease Outcome: Progressing

## 2023-12-15 NOTE — Progress Notes (Signed)
 PROGRESS NOTE  Kathryn Harris, is a 33 y.o. female, DOB - 11-28-1990, FMW:[id number]  Admit date - 12/14/2023   Admitting Physician Caliah Kopke Pearlean, MD  Outpatient Primary MD for the patient is Shona Norleen PEDLAR, MD  LOS - 0  Chief Complaint  Patient presents with   Shortness of Breath   Brief Narrative:  33 y.o. female who is a non-smoker with medical history significant morbid obesity, asthma, mild chronic anemia, gastritis and duodenitis admitted on 12/14/2023 with acute hypoxic respiratory failure secondary to asthma flareup    -Assessment and Plan: 1) acute asthma exacerbation--patient is a non-smoker - Chest x-ray without acute findings, no leukocytosis -Patient reports that she was diagnosed with COVID back on 08/27/2023 and respiratory status has not returned to normal since. Patient was seen in the at Urgent Care  on 12/13/2023 with similar complaints was treated symptomatically apparently COVID and flu test was negative at that time -COVID, flu and RSV negative again today 12/14/2023 -- As of 12/15/2023 cough, wheezing, dyspnea and hypoxia persist IV Solu-Medrol , bronchodilators and mucolytics   2) acute hypoxic respiratory failure -- Due to asthma/elevation as above #1 - Management as above #1 -As of 12/15/2023 nasal cannula oxygen  weaned down to 2 L per nasal cannula from 5 L on admission - No chest pain, no leg pains, no leg swelling or pleuritic symptoms   3)Morbid Obesity- -Low calorie diet, portion control and increase physical activity discussed with patient -Body mass index is 51.59 kg/m.   4)H/o gastroduodenitis--continue Protonix  while on high-dose steroids   5) history of mild chronic anemia--anemia workup from July 2025 and repeat anemia workup from November 2025 without any evidence of, B12 or folate or iron  deficiency at that time =-No concerns about ongoing bleeding at this time  Status is: Inpatient   Disposition: The patient is from: Home               Anticipated d/c is to: Home              Anticipated d/c date is: 1 day              Patient currently is not medically stable to d/c. Barriers: Not Clinically Stable-   Code Status :  -  Code Status: Full Code   Family Communication:    NA (patient is alert, awake and coherent)   DVT Prophylaxis  :   - SCDs   heparin  injection 5,000 Units Start: 12/14/23 2200 SCDs Start: 12/14/23 1955 Place TED hose Start: 12/14/23 1955   Lab Results  Component Value Date   PLT 206 12/14/2023    Inpatient Medications  Scheduled Meds:  dextromethorphan -guaiFENesin   1 tablet Oral BID   heparin   5,000 Units Subcutaneous Q8H   ipratropium-albuterol   3 mL Nebulization Q6H   methylPREDNISolone  (SOLU-MEDROL ) injection  62.5 mg Intravenous Q12H   pantoprazole   40 mg Oral Daily   sodium chloride  flush  3 mL Intravenous Q12H   sodium chloride  flush  3 mL Intravenous Q12H   Continuous Infusions:  sodium chloride      PRN Meds:.sodium chloride , acetaminophen  **OR** acetaminophen , albuterol , bisacodyl , hydrALAZINE , ondansetron  **OR** ondansetron  (ZOFRAN ) IV, polyethylene glycol, sodium chloride  flush, traZODone    Anti-infectives (From admission, onward)    None         Subjective: Kathryn Harris today has no fevers, no emesis,  No chest pain,    Cough, wheezing and dyspnea persist --Oxygen  requirement weaned down to 3 L from 5 L - Becomes very dyspneic  with minimal exertion - No leg pains or pleuritic symptoms   Objective: Vitals:   12/14/23 2224 12/15/23 0218 12/15/23 0533 12/15/23 0822  BP: (!) 105/59 125/67 (!) 117/2   Pulse: (!) 104 83 88   Resp: 20 18 20    Temp: 98.2 F (36.8 C) 98.2 F (36.8 C) 97.9 F (36.6 C)   TempSrc: Oral Oral Oral   SpO2: 97% 94% 98% 97%  Weight:      Height:       No intake or output data in the 24 hours ending 12/15/23 1242 Filed Weights   12/14/23 0719  Weight: (!) 140.6 kg    Physical Exam  Gen:- Awake Alert, morbidly obese with  dyspnea on minimal exertion HEENT:- [place].AT, No sclera icterus] Nose- [place] 3L/min Neck-Supple Neck,No JVD,.  Lungs-diminished breath sounds with scattered wheezes  CV- S1, S2 normal, regular  Abd-  +ve B.Sounds, Abd Soft, No tenderness, increased truncal adiposity Extremity/Skin:- No  edema, neg homan's pedal pulses present  Psych-affect is appropriate, oriented x3 Neuro-no new focal deficits, no tremors  Data Reviewed: I have personally reviewed following labs and imaging studies  CBC: Recent Labs  Lab 12/14/23 0736  WBC 6.3  HGB 11.4*  HCT 36.8  MCV 75.1*  PLT 206   Basic Metabolic Panel: Recent Labs  Lab 12/14/23 0749  NA 139  K 3.6  CL 105  CO2 22  GLUCOSE 124*  BUN 5*  CREATININE 0.68  CALCIUM 8.8*   GFR: Estimated Creatinine Clearance: 141.4 mL/min (by C-G formula based on SCr of 0.68 mg/dL).  Recent Results (from the past 240 hours)  Resp panel by RT-PCR (RSV, Flu A&B, Covid) Anterior Nasal Swab     Status: None   Collection Time: 12/14/23 10:06 AM   Specimen: Anterior Nasal Swab  Result Value Ref Range Status   SARS Coronavirus 2 by RT PCR NEGATIVE NEGATIVE Final    Comment: (NOTE) SARS-CoV-2 target nucleic acids are NOT DETECTED.  The SARS-CoV-2 RNA is generally detectable in upper respiratory specimens during the acute phase of infection. The lowest concentration of SARS-CoV-2 viral copies this assay can detect is 138 copies/mL. A negative result does not preclude SARS-Cov-2 infection and should not be used as the sole basis for treatment or other patient management decisions. A negative result may occur with  improper specimen collection/handling, submission of specimen other than nasopharyngeal swab, presence of viral mutation(s) within the areas targeted by this assay, and inadequate number of viral copies(<138 copies/mL). A negative result must be combined with clinical observations, patient history, and epidemiological information. The expected  result is Negative.  Fact Sheet for Patients:  bloggercourse.com  Fact Sheet for Healthcare Providers:  seriousbroker.it  This test is no t yet approved or cleared by the [place]  FDA and  has been authorized for detection and/or diagnosis of SARS-CoV-2 by FDA under an Emergency Use Authorization (EUA). This EUA will remain  in effect (meaning this test can be used) for the duration of the COVID-19 declaration under Section 564(b)(1) of the Act, 21 U.S.C.section 360bbb-3(b)(1), unless the authorization is terminated  or revoked sooner.       Influenza A by PCR NEGATIVE NEGATIVE Final   Influenza B by PCR NEGATIVE NEGATIVE Final    Comment: (NOTE) The Xpert Xpress SARS-CoV-2/FLU/RSV plus assay is intended as an aid in the diagnosis of influenza from Nasopharyngeal swab specimens and should not be used as a sole basis for treatment. Nasal washings and aspirates are unacceptable for  Xpert Xpress SARS-CoV-2/FLU/RSV testing.  Fact Sheet for Patients: bloggercourse.com  Fact Sheet for Healthcare Providers: seriousbroker.it  This test is not yet approved or cleared by the [place]  FDA and has been authorized for detection and/or diagnosis of SARS-CoV-2 by FDA under an Emergency Use Authorization (EUA). This EUA will remain in effect (meaning this test can be used) for the duration of the COVID-19 declaration under Section 564(b)(1) of the Act, 21 U.S.C. section 360bbb-3(b)(1), unless the authorization is terminated or revoked.     Resp Syncytial Virus by PCR NEGATIVE NEGATIVE Final    Comment: (NOTE) Fact Sheet for Patients: bloggercourse.com  Fact Sheet for Healthcare Providers: seriousbroker.it  This test is not yet approved or cleared by the [place]  FDA and has been authorized for detection and/or diagnosis of  SARS-CoV-2 by FDA under an Emergency Use Authorization (EUA). This EUA will remain in effect (meaning this test can be used) for the duration of the COVID-19 declaration under Section 564(b)(1) of the Act, 21 U.S.C. section 360bbb-3(b)(1), unless the authorization is terminated or revoked.  Performed at [facility], [address], [place], [place] [address]     Radiology Studies: [facility] Chest [facility] 1 View Result Date: 12/14/2023 EXAM: 1 VIEW(S) XRAY OF THE CHEST 12/14/2023 07:27:36 AM COMPARISON: Comparison chest radiographs 10/12/2023 and earlier. CLINICAL HISTORY: 33 year old female with shortness of breath. FINDINGS: LUNGS AND PLEURA: No confluent lung opacity. No pleural effusion. No pneumothorax. When allowing for portable technique both lungs are clear. HEART AND MEDIASTINUM: No acute abnormality of the cardiac and mediastinal silhouettes. BONES AND SOFT TISSUES: No acute osseous abnormality. IMPRESSION: 1. Negative portable chest. Electronically signed by: Helayne Hurst MD 12/14/2023 07:30 AM EST RP Workstation: [id number]   Scheduled Meds:  dextromethorphan -guaiFENesin   1 tablet Oral BID   heparin   5,000 Units Subcutaneous Q8H   ipratropium-albuterol   3 mL Nebulization Q6H   methylPREDNISolone  (SOLU-MEDROL ) injection  62.5 mg Intravenous Q12H   pantoprazole   40 mg Oral Daily   sodium chloride  flush  3 mL Intravenous Q12H   sodium chloride  flush  3 mL Intravenous Q12H   Continuous Infusions:  sodium chloride       LOS: 0 days   Rendall Carwin M.D on 12/15/2023 at 12:42 PM  Go to [email or web address] - for contact info  [facility] - Office  [phone]  If 7PM-7AM, please contact night-coverage [email or web address] 12/15/2023, 12:42 PM

## 2023-12-15 NOTE — Progress Notes (Signed)
°   12/15/23 1029  TOC Brief Assessment  Insurance and Status Reviewed  Patient has primary care physician Yes  Home environment has been reviewed From Home  Prior level of function: Independent  Prior/Current Home Services No current home services  Social Drivers of Health Review SDOH reviewed no interventions necessary  Readmission risk has been reviewed Yes  Transition of care needs no transition of care needs at this time    Inpatient Care Management (ICM) has reviewed patient and no other ICM needs have been identified at this time. We will continue to monitor patient advancement through interdisciplinary progression rounds. If new patient transition needs arise, please place a ICM consult.

## 2023-12-16 DIAGNOSIS — J4551 Severe persistent asthma with (acute) exacerbation: Secondary | ICD-10-CM

## 2023-12-16 DIAGNOSIS — E66813 Obesity, class 3: Secondary | ICD-10-CM | POA: Diagnosis not present

## 2023-12-16 DIAGNOSIS — J9601 Acute respiratory failure with hypoxia: Secondary | ICD-10-CM | POA: Diagnosis not present

## 2023-12-16 LAB — GLUCOSE, CAPILLARY: Glucose-Capillary: 125 mg/dL — ABNORMAL HIGH (ref 70–99)

## 2023-12-16 MED ORDER — ALBUTEROL SULFATE HFA 108 (90 BASE) MCG/ACT IN AERS: 2.0000 | INHALATION_SPRAY | Freq: Four times a day (QID) | RESPIRATORY_TRACT | 2 refills | Status: DC | PRN

## 2023-12-16 MED ORDER — PANTOPRAZOLE SODIUM 40 MG PO TBEC: 40.0000 mg | DELAYED_RELEASE_TABLET | Freq: Every day | ORAL | 3 refills | Status: DC

## 2023-12-16 MED ORDER — PREDNISONE 20 MG PO TABS: 40.0000 mg | ORAL_TABLET | Freq: Every day | ORAL | 0 refills | Status: DC

## 2023-12-16 MED ORDER — AZELASTINE HCL 0.1 % NA SOLN: 2.0000 | Freq: Two times a day (BID) | NASAL | 4 refills | Status: DC

## 2023-12-16 MED ORDER — BUDESONIDE-FORMOTEROL FUMARATE 160-4.5 MCG/ACT IN AERO: 2.0000 | INHALATION_SPRAY | Freq: Two times a day (BID) | RESPIRATORY_TRACT | 5 refills | Status: DC

## 2023-12-16 MED ORDER — ALBUTEROL SULFATE (2.5 MG/3ML) 0.083% IN NEBU: 2.5000 mg | INHALATION_SOLUTION | RESPIRATORY_TRACT | 4 refills | Status: DC | PRN

## 2023-12-16 NOTE — Plan of Care (Signed)

## 2023-12-16 NOTE — Discharge Instructions (Signed)
 1) please avoid exposure to secondhand tobacco smoke, avoid exposure to chemical irritants including bleach and cleaning agents  2) please use your Symbicort  (budesonide /formoterol ) twice daily for asthma maintenance, NOT for Rescue   3) follow-up with your primary care physician within a week for recheck and reevaluation

## 2023-12-16 NOTE — Discharge Summary (Signed)
 Kathryn Harris, is a 33 y.o. female  DOB [birth year]  MRN [id number].  Admission date:  12/14/2023  Admitting Physician  Rendall Carwin, MD  Discharge Date:  12/16/2023   Primary MD  Shona Norleen PEDLAR, MD  Recommendations for primary care physician for things to follow:  1) please avoid exposure to secondhand tobacco smoke, avoid exposure to chemical irritants including bleach and cleaning agents  2) please use your Symbicort  (budesonide /formoterol ) twice daily for asthma maintenance, NOT for Rescue   3) follow-up with your primary care physician within a week for recheck and reevaluation  Admission Diagnosis  Asthma attack [J45.901] Moderate persistent asthma with acute exacerbation [J45.41]   Discharge Diagnosis  Asthma attack [J45.901] Moderate persistent asthma with acute exacerbation [J45.41]    Principal Problem:   Asthma attack Active Problems:   Acute respiratory failure with hypoxia (HCC)   Iron  deficiency anemia due to chronic blood loss   Obesity, Class III, BMI 40-49.9 (morbid obesity) (HCC)   B12 deficiency      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 female who is a non-smoker with medical history significant morbid obesity, asthma, mild chronic  anemia, gastritis and duodenitis presents with worsening cough dyspnea and wheezing and is found to be hypoxic in the ED -Patient reports that she was diagnosed with COVID back on 08/27/2023 and respiratory status has not returned to normal since. - Patient reports loss of smell, persistent nasal congestion - Patient was seen in the at Urgent Care  on 12/13/2023 with similar complaints was treated symptomatically apparently COVID and flu test was negative at the time - No vomiting, no diarrhea,, no fevers or chills =-She is very dyspneic and requiring 5 L of oxygen  at rest at this time - No chest pain, no leg pains, no leg swelling or pleuritic symptoms Chest x-ray without acute fine - WBC 6.3 hemoglobin 11.4 platelets 206 -Creatinine 0.68, potassium is 3.6 sodium is 139 - Review of Systems: As mentioned in the history of present illness. All other systems reviewed and are negative.     Hospital Course:     Brief Narrative:  33 y.o. female who is a non-smoker with medical history significant morbid obesity, asthma, mild chronic anemia, gastritis and duodenitis admitted on 12/14/2023 with acute hypoxic respiratory failure secondary to asthma flareup     -  Assessment and Plan: 1) acute asthma exacerbation--patient is a non-smoker - Chest x-ray without acute findings, no leukocytosis -Patient reports that she was diagnosed with COVID back on 08/27/2023 and respiratory status has not returned to normal since. Patient was seen in the at Urgent Care  on 12/13/2023 with similar complaints was treated symptomatically apparently COVID and flu test was negative at that time -COVID, flu and RSV negative again today 12/14/2023 - Hypoxia resolved with IV steroids and bronchodilators - Post ambulation O2 sats 95 to 96% on room air - Okay to discharge on bronchodilators and prednisone   2) acute hypoxic respiratory failure -- Due to asthma/elevation as above #1 - Management as above #1 --See #1  above - No chest pain, no leg pains, no leg swelling or pleuritic symptoms   3)Morbid Obesity- -Low calorie diet, portion control and increase physical activity discussed with patient -Body mass index is 51.59 kg/m.   4)H/o gastroduodenitis--continue PPI   5) history of mild chronic anemia--anemia workup from July 2025 and repeat anemia workup from November 2025 without any evidence of, B12 or folate or iron  deficiency at that time =-No concerns about ongoing bleeding at this time -Patient apparently gets outpatient iron  infusions she can continue same   Disposition: The patient is from: Home              Anticipated d/c is to: Home  Discharge Condition: Stable  Follow UP   Follow-up Information     Shona Norleen PEDLAR, MD. Schedule an appointment as soon as possible for a visit in 1 week(s).   Specialty: Internal Medicine Contact information: [address] [place] [address] [phone]                 Diet and Activity recommendation:  As advised  Discharge Instructions    Discharge Instructions     Call MD for:  difficulty breathing, headache or visual disturbances   Complete by: As directed    Call MD for:  persistant dizziness or light-headedness   Complete by: As directed    Call MD for:  persistant nausea and vomiting   Complete by: As directed    Call MD for:  temperature >100.4   Complete by: As directed    Diet - low sodium heart healthy   Complete by: As directed    Discharge instructions   Complete by: As directed    1) please avoid exposure to secondhand tobacco smoke, avoid exposure to chemical irritants including bleach and cleaning agents  2) please use your Symbicort  (budesonide /formoterol ) twice daily for asthma maintenance, NOT for Rescue   3) follow-up with your primary care physician within a week for recheck and reevaluation   Increase activity slowly   Complete by: As directed        Discharge Medications     Allergies as  of 12/16/2023   No Known Allergies      Medication List     TAKE these medications    albuterol  (2.5 MG/3ML) 0.083% nebulizer solution Commonly known as: PROVENTIL  Take 3 mLs (2.5 mg total) by nebulization every 4 (four) hours as needed for wheezing or shortness of breath. What changed: when to take this   albuterol  108 (90 Base) MCG/ACT inhaler Commonly known as: VENTOLIN  HFA Inhale 2 puffs into the lungs every 6 (six) hours as needed for wheezing or shortness of breath. What changed: You were already taking a medication with the same name, and this prescription was added. Make  sure you understand how and when to take each.   azelastine  0.1 % nasal spray Commonly known as: ASTELIN  Place 2 sprays into both nostrils 2 (two) times daily. Use in each nostril as directed   budesonide -formoterol  160-4.5 MCG/ACT inhaler Commonly known as: SYMBICORT  Inhale 2 puffs into the lungs 2 (two) times daily. For chronic Asthma Maintenance, NOT for Rescue   pantoprazole  40 MG tablet Commonly known as: PROTONIX  Take 1 tablet (40 mg total) by mouth daily.   predniSONE  20 MG tablet Commonly known as: DELTASONE  Take 2 tablets (40 mg total) by mouth daily with breakfast for 5 days.   promethazine -dextromethorphan  6.25-15 MG/5ML syrup Commonly known as: PROMETHAZINE -DM Take 5 mLs by mouth 4 (four) times daily as needed.       Major procedures and Radiology Reports - PLEASE review detailed and final reports for all details, in brief -   DG Chest Port 1 View Result Date: 12/14/2023 EXAM: 1 VIEW(S) XRAY OF THE CHEST 12/14/2023 07:27:36 AM COMPARISON: Comparison chest radiographs 10/12/2023 and earlier. CLINICAL HISTORY: 33 year old female with shortness of breath. FINDINGS: LUNGS AND PLEURA: No confluent lung opacity. No pleural effusion. No pneumothorax. When allowing for portable technique both lungs are clear. HEART AND MEDIASTINUM: No acute abnormality of the cardiac and mediastinal  silhouettes. BONES AND SOFT TISSUES: No acute osseous abnormality. IMPRESSION: 1. Negative portable chest. Electronically signed by: Helayne Hurst MD 12/14/2023 07:30 AM EST RP Workstation: [id number]    Micro Results  Recent Results (from the past 240 hours)  Resp panel by RT-PCR (RSV, Flu A&B, Covid) Anterior Nasal Swab     Status: None   Collection Time: 12/14/23 10:06 AM   Specimen: Anterior Nasal Swab  Result Value Ref Range Status   SARS Coronavirus 2 by RT PCR NEGATIVE NEGATIVE Final    Comment: (NOTE) SARS-CoV-2 target nucleic acids are NOT DETECTED.  The SARS-CoV-2 RNA is generally detectable in upper respiratory specimens during the acute phase of infection. The lowest concentration of SARS-CoV-2 viral copies this assay can detect is 138 copies/mL. A negative result does not preclude SARS-Cov-2 infection and should not be used as the sole basis for treatment or other patient management decisions. A negative result may occur with  improper specimen collection/handling, submission of specimen other than nasopharyngeal swab, presence of viral mutation(s) within the areas targeted by this assay, and inadequate number of viral copies(<138 copies/mL). A negative result must be combined with clinical observations, patient history, and epidemiological information. The expected result is Negative.  Fact Sheet for Patients:  bloggercourse.com  Fact Sheet for Healthcare Providers:  seriousbroker.it  This test is no t yet approved or cleared by the [place]  FDA and  has been authorized for detection and/or diagnosis of SARS-CoV-2 by FDA under an Emergency Use Authorization (EUA). This EUA will remain  in effect (meaning this test can be used) for the duration of the COVID-19 declaration under Section 564(b)(1) of the Act, 21 U.S.C.section 360bbb-3(b)(1), unless the authorization is terminated  or revoked sooner.        Influenza A by PCR NEGATIVE NEGATIVE Final   Influenza B by PCR NEGATIVE NEGATIVE Final    Comment: (NOTE) The Xpert Xpress SARS-CoV-2/FLU/RSV plus assay is intended as an aid in the diagnosis of influenza from Nasopharyngeal swab specimens and should not be used as a sole basis for treatment. Nasal washings and aspirates are unacceptable for Xpert Xpress SARS-CoV-2/FLU/RSV testing.  Fact Sheet for Patients: bloggercourse.com  Fact Sheet for Healthcare Providers: seriousbroker.it  This test is not yet approved or cleared by the [place]  FDA and has been authorized for detection and/or diagnosis of SARS-CoV-2 by FDA under an Emergency Use Authorization (EUA). This EUA will remain in effect (meaning this test can be used) for the duration of the COVID-19 declaration under Section 564(b)(1) of the Act, 21 U.S.C. section 360bbb-3(b)(1), unless the authorization is terminated or revoked.     Resp Syncytial Virus by PCR NEGATIVE NEGATIVE Final    Comment: (NOTE) Fact Sheet for Patients: bloggercourse.com  Fact Sheet for Healthcare Providers: seriousbroker.it  This test is not yet approved or cleared by the [place]  FDA and has been authorized for detection and/or diagnosis of SARS-CoV-2 by FDA under an Emergency Use Authorization (EUA). This EUA will remain in effect (meaning this test can be used) for the duration of the COVID-19 declaration under Section 564(b)(1) of the Act, 21 U.S.C. section 360bbb-3(b)(1), unless the authorization is terminated or revoked.  Performed at [facility], [address], [place], [place] [address]    Today   Subjective    Kathryn Harris today has no new complaint - Cough wheezing and dyspnea has improved significantly       -- Post ambulation O2 sats 95 to 96% on room air  Patient has been seen and examined prior to discharge    Objective   Blood pressure 124/75, pulse 78, temperature 98.3 F (36.8 C), temperature source Oral, resp. rate 16, height 5' 5 (1.651 m), weight (!) 140.6 kg, last menstrual period 12/05/2023, SpO2 96%.   Intake/Output Summary (Last 24 hours) at 12/16/2023 0955 Last data filed at 12/15/2023 2015 Gross per 24 hour  Intake 240 ml  Output --  Net 240 ml    Exam Gen:- Awake Alert, no acute distress, morbid obesity, speaking in complete sentences HEENT:- Burns.AT, No sclera icterus Neck-Supple Neck,No JVD,.  Lungs-much improved air movement, no wheezing CV- S1, S2 normal, regular Abd-  +ve B.Sounds, Abd Soft, No tenderness, increased truncal adiposity    Extremity/Skin:- No  edema,   good pulses Psych-affect is appropriate, oriented x3 Neuro-no new focal deficits, no tremors    Data Review   CBC w Diff:  Lab Results  Component Value Date   WBC 6.3 12/14/2023   HGB 11.4 (L) 12/14/2023   HCT 36.8 12/14/2023   PLT 206 12/14/2023   LYMPHOPCT 24 10/12/2023   MONOPCT 6 10/12/2023   EOSPCT 3 10/12/2023   BASOPCT 0 10/12/2023   CMP:  Lab Results  Component Value Date   NA 139 12/14/2023   K 3.6 12/14/2023   CL 105 12/14/2023   CO2 22 12/14/2023   BUN 5 (L) 12/14/2023   CREATININE 0.68 12/14/2023   PROT 7.2 10/12/2023   ALBUMIN 4.0 10/12/2023   BILITOT <0.2 10/12/2023   ALKPHOS 94 10/12/2023   AST 16 10/12/2023   ALT 12 10/12/2023   Total Discharge time is about 33 minutes  Rendall Carwin M.D on 12/16/2023 at 9:55 AM  Go to [email or web address] -  for contact info  [facility] - Office  [phone]

## 2023-12-17 ENCOUNTER — Telehealth: Payer: Self-pay

## 2023-12-17 NOTE — Transitions of Care (Post Inpatient/ED Visit) (Signed)
° °  12/17/2023  Name: Kathryn Harris MRN: [id number] DOB: [birth year]  Today's TOC FU Call Status: Today's TOC FU Call Status:: Unsuccessful Call (1st Attempt) Unsuccessful Call (1st Attempt) Date: 12/17/23  Attempted to reach the patient regarding the most recent Inpatient/ED visit.  Follow Up Plan: Additional outreach attempts will be made to reach the patient to complete the Transitions of Care (Post Inpatient/ED visit) call.   Shuree Brossart J. Ramah Langhans RN, MSN [facility], [facility] RN Care Manager Direct Dial: [phone]  Fax: [phone] Website: [email or web address]

## 2023-12-18 ENCOUNTER — Telehealth: Payer: Self-pay

## 2023-12-18 NOTE — Patient Instructions (Signed)
 Visit Information  Thank you for taking time to visit with me today. Please don't hesitate to contact me if I can be of assistance to you.  WHEN TO SEEK EMERGENCY HELP Inability to talk without pausing for breath Chest muscles working hard to breathe Lips or face turning pale, gray, or blue Rescue inhaler not effective   Patient verbalizes understanding of instructions and care plan provided today and agrees to view in MyChart. Active MyChart status and patient understanding of how to access instructions and care plan via MyChart confirmed with patient.     The patient has been provided with contact information for the care management team and has been advised to call with any health related questions or concerns.   Please call the care guide team at [phone] if you need to cancel or reschedule your appointment.   Please call the Suicide and Crisis Lifeline: 988 call the [place]  National Suicide Prevention Lifeline: 240-104-5623 or TTY: 925-164-3092 TTY (936)468-6758) to talk to a trained counselor if you are experiencing a Mental Health or Behavioral Health Crisis or need someone to talk to.  Griffen Frayne J. Taiyo Kozma RN, MSN [facility], [facility] RN Care Manager Direct Dial: [phone]  Fax: [phone] Website: [email or web address]

## 2023-12-18 NOTE — Transitions of Care (Post Inpatient/ED Visit) (Signed)
 12/18/2023  Name: Kathryn Harris MRN: [id number] DOB: 1990/06/13  Today's TOC FU Call Status: Today's TOC FU Call Status:: Successful TOC FU Call Completed TOC FU Call Complete Date: 12/18/23  Patient's Name and Date of Birth confirmed. DOB, Name  Transition Care Management Follow-up Telephone Call Date of Discharge: 12/16/23 [facility] Type of Discharge: Inpatient Admission Primary Inpatient Discharge Diagnosis:: Asthma attack How have you been since you were released from the hospital?: Better Any questions or concerns?: No  Items Reviewed: Did you receive and understand the discharge instructions provided?: Yes Medications obtained,verified, and reconciled?: Yes (Medications Reviewed) Any new allergies since your discharge?: No Dietary orders reviewed?: Yes Type of Diet Ordered:: Heart Healthy Do you have support at home?: Yes People in Home [RPT]: parent(s) Name of Support/Comfort Primary Source: [name]  Medications Reviewed Today: Medications Reviewed Today     Reviewed by Kashira Behunin, RN (Case Manager) on 12/18/23 at 1206  Med List Status: <None>   Medication Order Taking? Sig Documenting Provider Last Dose Status Informant  albuterol  (PROVENTIL ) (2.5 MG/3ML) 0.083% nebulizer solution [id number] Yes Take 3 mLs (2.5 mg total) by nebulization every 4 (four) hours as needed for wheezing or shortness of breath. Pearlean Manus, MD  Active   albuterol  (VENTOLIN  HFA) 108 (90 Base) MCG/ACT inhaler [id number] Yes Inhale 2 puffs into the lungs every 6 (six) hours as needed for wheezing or shortness of breath. Pearlean Manus, MD  Active   azelastine  (ASTELIN ) 0.1 % nasal spray [id number] Yes Place 2 sprays into both nostrils 2 (two) times daily. Use in each nostril as directed Pearlean Manus, MD  Active   budesonide -formoterol  (SYMBICORT ) 160-4.5 MCG/ACT inhaler [id number] Yes Inhale 2 puffs into the lungs 2 (two) times daily. For chronic Asthma  Maintenance, NOT for Rescue Pearlean Manus, MD  Active   pantoprazole  (PROTONIX ) 40 MG tablet [id number] Yes Take 1 tablet (40 mg total) by mouth daily. Pearlean Manus, MD  Active   predniSONE  (DELTASONE ) 20 MG tablet [id number] Yes Take 2 tablets (40 mg total) by mouth daily with breakfast for 5 days. Pearlean Manus, MD  Active   promethazine -dextromethorphan  (PROMETHAZINE -DM) 6.25-15 MG/5ML syrup [id number] Yes Take 5 mLs by mouth 4 (four) times daily as needed. Tanysha Quant-Warren, Etta PARAS, NP  Active             Home Care and Equipment/Supplies: Were Home Health Services Ordered?: NA Any new equipment or medical supplies ordered?: NA  Functional Questionnaire: Do you need assistance with bathing/showering or dressing?: No Do you need assistance with meal preparation?: No Do you need assistance with eating?: No Do you have difficulty maintaining continence: No Do you need assistance with getting out of bed/getting out of a chair/moving?: No Do you have difficulty managing or taking your medications?: No  Follow up appointments reviewed: PCP Follow-up appointment confirmed?: Yes Date of PCP follow-up appointment?: 12/19/23 Follow-up Provider: Dr. Shona Driscilla Salvage Follow-up appointment confirmed?: Yes Date of Specialist follow-up appointment?: 12/21/23 Follow-Up Specialty Provider:: Dr. Darlean Do you need transportation to your follow-up appointment?: No Do you understand care options if your condition(s) worsen?: Yes-patient verbalized understanding  SDOH Interventions Today    Flowsheet Row Most Recent Value  SDOH Interventions   Food Insecurity Interventions Intervention Not Indicated  Housing Interventions Intervention Not Indicated  Transportation Interventions Intervention Not Indicated  Utilities Interventions Intervention Not Indicated    Gurnoor Sloop J. Nina Mondor RN, MSN [place]  [facility], [facility] RN Care Manager Direct Dial:  [phone]  Fax: [phone] Website: [email or web address]

## 2023-12-20 ENCOUNTER — Inpatient Hospital Stay: Attending: Hematology

## 2023-12-20 VITALS — BP 133/85 | HR 77 | Temp 97.1°F | Resp 19

## 2023-12-20 DIAGNOSIS — N920 Excessive and frequent menstruation with regular cycle: Secondary | ICD-10-CM | POA: Insufficient documentation

## 2023-12-20 DIAGNOSIS — D500 Iron deficiency anemia secondary to blood loss (chronic): Secondary | ICD-10-CM | POA: Insufficient documentation

## 2023-12-20 MED ORDER — FAMOTIDINE IN NACL 20-0.9 MG/50ML-% IV SOLN
20.0000 mg | Freq: Once | INTRAVENOUS | Status: AC
  Administered 2023-12-20: 09:00:00 20 mg via INTRAVENOUS
  Filled 2023-12-20: qty 50

## 2023-12-20 MED ORDER — ACETAMINOPHEN 325 MG PO TABS
650.0000 mg | ORAL_TABLET | Freq: Once | ORAL | Status: AC
  Administered 2023-12-20: 08:00:00 650 mg via ORAL
  Filled 2023-12-20: qty 2

## 2023-12-20 MED ORDER — SODIUM CHLORIDE 0.9 % IV SOLN
1000.0000 mg | Freq: Once | INTRAVENOUS | Status: AC
  Administered 2023-12-20: 09:00:00 1000 mg via INTRAVENOUS
  Filled 2023-12-20: qty 20

## 2023-12-20 MED ORDER — METHYLPREDNISOLONE SODIUM SUCC 125 MG IJ SOLR
125.0000 mg | Freq: Once | INTRAMUSCULAR | Status: AC
  Administered 2023-12-20: 08:00:00 125 mg via INTRAVENOUS
  Filled 2023-12-20: qty 2

## 2023-12-20 MED ORDER — SODIUM CHLORIDE 0.9 % IV SOLN: INTRAVENOUS | Status: DC

## 2023-12-20 MED ORDER — CETIRIZINE HCL 10 MG/ML IV SOLN
10.0000 mg | Freq: Once | INTRAVENOUS | Status: AC
  Administered 2023-12-20: 08:00:00 10 mg via INTRAVENOUS
  Filled 2023-12-20: qty 1

## 2023-12-20 NOTE — Patient Instructions (Signed)
 [facility] - A DEPT OF [facility]  Discharge Instructions: Thank you for choosing [facility] to provide your oncology and hematology care.  If you have a lab appointment with the Cancer Center - please note that after April 8th, 2024, all labs will be drawn in the cancer center.  You do not have to check in or register with the main entrance as you have in the past but will complete your check-in in the cancer center.  Wear comfortable clothing and clothing appropriate for easy access to any Portacath or PICC line.   We strive to give you quality time with your provider. You may need to reschedule your appointment if you arrive late (15 or more minutes).  Arriving late affects you and other patients whose appointments are after yours.  Also, if you miss three or more appointments without notifying the office, you may be dismissed from the clinic at the provider's discretion.      For prescription refill requests, have your pharmacy contact our office and allow 72 hours for refills to be completed.    Today you received Infed IV iron infusion.    BELOW ARE SYMPTOMS THAT SHOULD BE REPORTED IMMEDIATELY: *FEVER GREATER THAN 100.4 F (38 C) OR HIGHER *CHILLS OR SWEATING *NAUSEA AND VOMITING THAT IS NOT CONTROLLED WITH YOUR NAUSEA MEDICATION *UNUSUAL SHORTNESS OF BREATH *UNUSUAL BRUISING OR BLEEDING *URINARY PROBLEMS (pain or burning when urinating, or frequent urination) *BOWEL PROBLEMS (unusual diarrhea, constipation, pain near the anus) TENDERNESS IN MOUTH AND THROAT WITH OR WITHOUT PRESENCE OF ULCERS (sore throat, sores in mouth, or a toothache) UNUSUAL RASH, SWELLING OR PAIN  UNUSUAL VAGINAL DISCHARGE OR ITCHING   Items with * indicate a potential emergency and should be followed up as soon as possible or go to the Emergency Department if any problems should occur.  Please show the CHEMOTHERAPY ALERT CARD or IMMUNOTHERAPY ALERT CARD at  check-in to the Emergency Department and triage nurse.  Should you have questions after your visit or need to cancel or reschedule your appointment, please contact [facility] [place] - A DEPT OF [facility] [phone]  and follow the prompts.  Office hours are 8:00 a.m. to 4:30 p.m. Monday - Friday. Please note that voicemails left after 4:00 p.m. may not be returned until the following business day.  We are closed weekends and major holidays. You have access to a nurse at all times for urgent questions. Please call the main number to the clinic [phone] and follow the prompts.  For any non-urgent questions, you may also contact your provider using MyChart. We now offer e-Visits for anyone 42 and older to request care online for non-urgent symptoms. For details visit [email or web address].   Also download the MyChart app! Go to the app store, search "MyChart", open the app, select [facility], and log in with your MyChart username and password.

## 2023-12-20 NOTE — Progress Notes (Signed)
Patient presents today for iron infusion. Patient is in satisfactory condition with no new complaints voiced.  Vital signs are stable.  We will proceed with infusion per provider orders.    Peripheral IV started with good blood return pre and post infusion.  Infed 1,000 mg given today per MD orders. Tolerated infusion without adverse affects. Vital signs stable. No complaints at this time. Discharged from clinic ambulatory in stable condition. Alert and oriented x 3. F/U with [facility] as scheduled.

## 2023-12-21 ENCOUNTER — Encounter: Payer: Self-pay | Admitting: Internal Medicine

## 2023-12-21 ENCOUNTER — Ambulatory Visit: Admitting: Internal Medicine

## 2023-12-21 VITALS — BP 147/92 | HR 109 | Ht 65.0 in | Wt 328.0 lb

## 2023-12-21 DIAGNOSIS — Z6841 Body Mass Index (BMI) 40.0 and over, adult: Secondary | ICD-10-CM

## 2023-12-21 DIAGNOSIS — J45998 Other asthma: Secondary | ICD-10-CM | POA: Diagnosis not present

## 2023-12-21 DIAGNOSIS — R0609 Other forms of dyspnea: Secondary | ICD-10-CM

## 2023-12-21 MED ORDER — BREZTRI AEROSPHERE 160-9-4.8 MCG/ACT IN AERO: 2.0000 | INHALATION_SPRAY | Freq: Two times a day (BID) | RESPIRATORY_TRACT | Status: AC

## 2023-12-21 NOTE — Assessment & Plan Note (Addendum)
 Body mass index is 54.58 kg/m.  -   No results found for: TSH    Contributing to doe and risk of GERD/dvt/ PE  >>>   reviewed the need and the process to achieve and maintain neg calorie balance > defer f/u primary care including intermittently monitoring thyroid status     Each maintenance medication was reviewed in detail including emphasizing most importantly the difference between maintenance and prns and under what circumstances the prns are to be triggered using an action plan format where appropriate.  Total time for H and P, chart review, counseling, reviewing hfa/neb  device(s) , directly observing portions of ambulatory 02 saturation study/ and generating customized AVS unique to this office visit / same day charting = 50 min new pt eval

## 2023-12-21 NOTE — Assessment & Plan Note (Addendum)
 Told she had asthma as child/ quit smoking in her 76s - See Admit Dec 2025 [facility] - 12/21/2023  After extensive coaching inhaler device,  effectiveness =    75% from a baseline of < 50% HFA suing Breztri sample as demonstrator,  so continue symbicort  160 2bid  DDX of  difficult airways management almost all start with A and  include Adherence, Ace Inhibitors, Acid Reflux, Active Sinus Disease, Alpha 1 Antitripsin deficiency, Anxiety masquerading as Airways dz,  ABPA,  Allergy(esp in young), Aspiration (esp in elderly), Adverse effects of meds,  Active smoking or vaping, A bunch of PE's (a small clot burden can't cause this syndrome unless there is already severe underlying pulm or vascular dz with poor reserve) plus two Bs  = Bronchiectasis and Beta blocker use..and one C= CHF   Adherence is always the initial prime suspect and is a multilayered concern that requires a trust but verify approach in every patient - starting with knowing how to use medications, especially inhalers, correctly, keeping up with refills and understanding the fundamental difference between maintenance and prns vs those medications only taken for a very short course and then stopped and not refilled.  - see hfa teaching   ? Allergy  > high dose ICS for now/ check allergy screen off [facility] on return in 4 weeks   ? Acid (or non-acid) GERD > always difficult to exclude as up to 75% of pts in some series report no assoc GI/ Heartburn symptoms> rec continue max (24h)  acid suppression and diet restrictions/ reviewed     She's doing so much better now that she is on ICS even though baseline hfa was suboptimal that I'm inclined to leave rx the same pending return with pfts

## 2023-12-21 NOTE — Progress Notes (Signed)
 "   Kathryn Harris, female    DOB: [birth year]    MRN: [id number]   Brief patient profile:  33  yobf  last smoked in her 69s ? Asthma as child but  does not remember and gone by HS  referred to pulmonary clinic in [place]  12/21/2023 by Dr Kathryn Harris office  for wheeze/ sob ? Related to wt gain (baseline = 250)  noted in 2024 then covid Aug 2025  [facility] and using saba at baseline 4-5 x hfa/ and neb bid     Admission date:  12/14/2023    Discharge Date:  12/16/2023     Admission Diagnosis  Asthma attack [J45.901] Moderate persistent asthma with acute exacerbation [J45.41]     Discharge Diagnosis  Asthma attack [J45.901]   Moderate persistent asthma with acute exacerbation [J45.41]     Acute respiratory failure with hypoxia (HCC)   Iron  deficiency anemia due to chronic blood loss   Obesity, Class III, BMI 40-49.9 (morbid obesity) (HCC)   B12 deficiency      Pt not previously seen by PCCM service.     History of Present Illness  12/21/2023  Pulmonary/ 1st office eval/ Kathryn Harris / [name] / symbicort  160 / prednisone  tapered to off 12/20/23  Chief Complaint  Patient presents with   Establish Care    Asthma / upper resp failure - hsp follow up  Had covid in August not been good ever since   Dyspnea:  staying home mostly / did foodlion one week  prior to OV   Cough: was minimal yellowish now gone Sleep: bed is flat / 2 pillows  SABA use: no hfa/ still neb twice daily  02: none    No obvious day to day or daytime pattern/variability or assoc excess/ purulent sputum or mucus plugs or hemoptysis or cp or chest tightness, subjective wheeze or overt sinus or hb symptoms.    Also denies any obvious fluctuation of symptoms with weather or environmental changes or other aggravating or alleviating factors except as outlined above   No unusual exposure hx or h/o childhood pna or knowledge of premature birth.  Current Allergies, Complete Past Medical History, Past Surgical History,  Family History, and Social History were reviewed in [employer] record.  ROS  The following are not active complaints unless bolded Hoarseness, sore throat, dysphagia, dental problems, itching, sneezing,  nasal congestion or discharge of excess mucus or purulent secretions, ear ache,   fever, chills, sweats, unintended wt loss or wt gain, classically pleuritic or exertional cp,  orthopnea pnd or arm/hand swelling  or leg swelling, presyncope, palpitations, abdominal pain, anorexia, nausea, vomiting, diarrhea  or change in bowel habits or change in bladder habits, change in stools or change in urine, dysuria, hematuria,  rash, arthralgias, visual complaints, headache, numbness, weakness or ataxia or problems with walking or coordination,  change in mood or  memory.            Outpatient Medications Prior to Visit  Medication Sig Dispense Refill   albuterol  (PROVENTIL ) (2.5 MG/3ML) 0.083% nebulizer solution Take 3 mLs (2.5 mg total) by nebulization every 4 (four) hours as needed for wheezing or shortness of breath. 75 mL 4   albuterol  (VENTOLIN  HFA) 108 (90 Base) MCG/ACT inhaler Inhale 2 puffs into the lungs every 6 (six) hours as needed for wheezing or shortness of breath. 1 each 2   azelastine  (ASTELIN ) 0.1 % nasal spray Place 2 sprays into both nostrils 2 (two) times  daily. Use in each nostril as directed 30 mL 4   budesonide -formoterol  (SYMBICORT ) 160-4.5 MCG/ACT inhaler Inhale 2 puffs into the lungs 2 (two) times daily. For chronic Asthma Maintenance, NOT for Rescue 10.2 g 5   pantoprazole  (PROTONIX ) 40 MG tablet Take 1 tablet (40 mg total) by mouth daily. 30 tablet 3   predniSONE  (DELTASONE ) 20 MG tablet Take 2 tablets (40 mg total) by mouth daily with breakfast for 5 days. 10 tablet 0   promethazine -dextromethorphan  (PROMETHAZINE -DM) 6.25-15 MG/5ML syrup Take 5 mLs by mouth 4 (four) times daily as needed. 118 mL 0   No facility-administered medications prior to visit.     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      Objective:     BP (!) 147/92   Pulse (!) 109   Ht 5' 5 (1.651 m)   Wt (!) 328 lb (148.8 kg)   LMP 12/05/2023 (Exact Date) Comment: last day of MP  SpO2 98% Comment: ra  BMI 54.58 kg/m   SpO2: 98 % (ra)  pleasant MO (by BMI) amb bf nad   HEENT : Oropharynx  clear      Nasal turbinates nl    NECK :  without  apparent JVD/ palpable Nodes/TM    LUNGS: no acc muscle use,  Nl contour chest which is clear to A and P bilaterally without cough on insp or exp maneuvers   CV:  RRR  no s3 or murmur or increase in P2, and no edema   ABD:  soft and nontender   MS:  Gait nl   ext warm without deformities Or obvious joint restrictions  calf tenderness, cyanosis or clubbing    SKIN: warm and dry without lesions    NEURO:  alert, approp, nl sensorium with  no motor or cerebellar deficits apparent.    I personally reviewed images and agree with radiology impression as follows:  CXR:   portable 12/14/23  Wnl / no hyperinflation related to er eval for sob     Assessment   Assessment & Plan DOE (dyspnea on exertion) 12/21/2023  @ wt 328 Walked on RA   x  3  lap(s) =  approx 450  ft  @ mod / fast  pace, stopped due to end of study  with lowest 02 sats 98% with min sob    Same ddx as asthma except for the addition of anemia and thyroid dz    Lab Results  Component Value Date   HGB 11.4 (L) 12/14/2023   HGB 11.8 (L) 11/15/2023   HGB 11.2 (L) 10/12/2023       No TSH on record, check with allergy screen on return    Persistent asthma with undetermined severity Told she had asthma as child/ quit smoking in her 44s - See Admit Dec 2025 [facility] - 12/21/2023  After extensive coaching inhaler device,  effectiveness =    75% from a baseline of < 50% HFA suing Breztri sample as demonstrator,  so continue symbicort  160 2bid  DDX  of  difficult airways management almost all start with A and  include Adherence, Ace Inhibitors, Acid Reflux, Active Sinus Disease, Alpha 1 Antitripsin deficiency, Anxiety masquerading as Airways dz,  ABPA,  Allergy(esp in young), Aspiration (esp in elderly), Adverse effects of meds,  Active smoking or vaping, A bunch of PE's (a small clot burden can't  cause this syndrome unless there is already severe underlying pulm or vascular dz with poor reserve) plus two Bs  = Bronchiectasis and Beta blocker use..and one C= CHF   Adherence is always the initial prime suspect and is a multilayered concern that requires a trust but verify approach in every patient - starting with knowing how to use medications, especially inhalers, correctly, keeping up with refills and understanding the fundamental difference between maintenance and prns vs those medications only taken for a very short course and then stopped and not refilled.  - see hfa teaching   ? Allergy  > high dose ICS for now/ check allergy screen off [facility] on return in 4 weeks   ? Acid (or non-acid) GERD > always difficult to exclude as up to 75% of pts in some series report no assoc GI/ Heartburn symptoms> rec continue max (24h)  acid suppression and diet restrictions/ reviewed     She's doing so much better now that she is on ICS even though baseline hfa was suboptimal that I'm inclined to leave rx the same pending return with pfts   Morbid (severe) obesity due to excess calories (HCC) Body mass index is 54.58 kg/m.  -   No results found for: TSH    Contributing to doe and risk of GERD/dvt/ PE  >>>   reviewed the need and the process to achieve and maintain neg calorie balance > defer f/u primary care including intermittently monitoring thyroid status     Each maintenance medication was reviewed in detail including emphasizing most importantly the difference between maintenance and prns and under what circumstances the prns are to be triggered  using an action plan format where appropriate.  Total time for H and P, chart review, counseling, reviewing hfa/neb  device(s) , directly observing portions of ambulatory 02 saturation study/ and generating customized AVS unique to this office visit / same day charting = 50 min new pt eval                 AVS  Patient Instructions  Plan A = Automatic = Always=    Symbicort  160 Take 2 puffs first thing in am and then another 2 puffs about 12 hours later.    Work on inhaler technique:  relax and gently blow all the way out then take a nice smooth full deep breath back in, triggering the inhaler at same time you start breathing in.  Hold breath in for at least  5 seconds if you can. Blow out  thru nose. Rinse and gargle with water when done.  If mouth or throat bother you at all,  try brushing teeth/gums/tongue with arm and hammer toothpaste/ make a slurry and gargle and spit out.       Plan B = Backup (to supplement plan A, not to replace it) Use your albuterol  inhaler as a rescue medication to be used if you can't catch your breath by resting or slowing your pace  or doing a relaxed purse lip breathing pattern.  - The less you use it, the better it will work when you need it. - Ok to use the inhaler up to 2 puffs  every 4 hours if you must but call for appointment if use goes up over your usual need - Don't leave home without it !!  (think of it like the spare tire or starter fluid for your car)   Plan C = Crisis (instead of Plan B but only if Plan B  stops working) - only use your albuterol  nebulizer if you first try Plan B and it fails to help > ok to use the nebulizer up to every 4 hours but if start needing it regularly call for immediate appointment   Continue Pantoprazole  (protonix ) 40 mg   Take  30-60 min before first meal of the day and if cough wheeze at night just add  Pepcid  (famotidine )  20 mg after supper of  before bedtime    Please schedule a follow up office visit in 4  weeks, sooner if needed  with all medications /inhalers/ solutions in hand so we can verify exactly what you are taking. This includes all medications from all doctors and over the counters   - PFTs on return   Add TSH on return         Ozell America, MD 12/21/2023      "

## 2023-12-21 NOTE — Assessment & Plan Note (Addendum)
 12/21/2023  @ wt 328 Walked on RA   x  3  lap(s) =  approx 450  ft  @ mod / fast  pace, stopped due to end of study  with lowest 02 sats 98% with min sob    Same ddx as asthma except for the addition of anemia and thyroid dz    Lab Results  Component Value Date   HGB 11.4 (L) 12/14/2023   HGB 11.8 (L) 11/15/2023   HGB 11.2 (L) 10/12/2023       No TSH on record, check with allergy screen on return

## 2023-12-21 NOTE — Patient Instructions (Addendum)
 Plan A = Automatic = Always=    Symbicort  160 Take 2 puffs first thing in am and then another 2 puffs about 12 hours later.    Work on inhaler technique:  relax and gently blow all the way out then take a nice smooth full deep breath back in, triggering the inhaler at same time you start breathing in.  Hold breath in for at least  5 seconds if you can. Blow out  thru nose. Rinse and gargle with water when done.  If mouth or throat bother you at all,  try brushing teeth/gums/tongue with arm and hammer toothpaste/ make a slurry and gargle and spit out.       Plan B = Backup (to supplement plan A, not to replace it) Use your albuterol  inhaler as a rescue medication to be used if you can't catch your breath by resting or slowing your pace  or doing a relaxed purse lip breathing pattern.  - The less you use it, the better it will work when you need it. - Ok to use the inhaler up to 2 puffs  every 4 hours if you must but call for appointment if use goes up over your usual need - Don't leave home without it !!  (think of it like the spare tire or starter fluid for your car)   Plan C = Crisis (instead of Plan B but only if Plan B stops working) - only use your albuterol  nebulizer if you first try Plan B and it fails to help > ok to use the nebulizer up to every 4 hours but if start needing it regularly call for immediate appointment   Continue Pantoprazole  (protonix ) 40 mg   Take  30-60 min before first meal of the day and if cough wheeze at night just add  Pepcid  (famotidine )  20 mg after supper of  before bedtime    Please schedule a follow up office visit in 4 weeks, sooner if needed  with all medications /inhalers/ solutions in hand so we can verify exactly what you are taking. This includes all medications from all doctors and over the counters   - PFTs on return   Add TSH on return

## 2023-12-31 ENCOUNTER — Encounter: Payer: Self-pay | Admitting: *Deleted

## 2024-01-16 ENCOUNTER — Encounter: Payer: Self-pay | Admitting: Oncology

## 2024-01-23 ENCOUNTER — Encounter: Payer: Self-pay | Admitting: Oncology

## 2024-01-29 ENCOUNTER — Ambulatory Visit (HOSPITAL_COMMUNITY): Admission: RE | Admit: 2024-01-29 | Payer: Self-pay | Source: Ambulatory Visit

## 2024-01-29 ENCOUNTER — Ambulatory Visit: Admitting: Internal Medicine

## 2024-01-29 DIAGNOSIS — J45998 Other asthma: Secondary | ICD-10-CM

## 2024-01-29 DIAGNOSIS — R0609 Other forms of dyspnea: Secondary | ICD-10-CM

## 2024-01-29 NOTE — Progress Notes (Unsigned)
 "   Kathryn Harris, female    DOB: 1990-12-14    MRN: [id number]   Brief patient profile:  33  yobf  last smoked in her 88s ? Asthma as child but  does not remember and gone by HS  referred to pulmonary clinic in [place]  12/21/2023 by Dr [place] office  for wheeze/ sob ? Related to wt gain (baseline = 250)  noted in 2024 then covid Aug 2025  [facility] and using saba at baseline 4-5 x hfa/ and neb bid     Admission date:  12/14/2023    Discharge Date:  12/16/2023     Admission Diagnosis  Asthma attack [J45.901] Moderate persistent asthma with acute exacerbation [J45.41]     Discharge Diagnosis  Asthma attack [J45.901]   Moderate persistent asthma with acute exacerbation [J45.41]     Acute respiratory failure with hypoxia (HCC)   Iron  deficiency anemia due to chronic blood loss   Obesity, Class III, BMI 40-49.9 (morbid obesity) (HCC)   B12 deficiency      Pt not previously seen by PCCM service.     History of Present Illness  12/21/2023  Pulmonary/ 1st office eval/ Kathryn Harris / [name] / symbicort  160 / prednisone  tapered to off 12/20/23  Chief Complaint  Patient presents with   Establish Care    Asthma / upper resp failure - hsp follow up  Had covid in August not been good ever since   Dyspnea:  staying home mostly / did food[facility] one week  prior to OV   Cough: was minimal yellowish now gone Sleep: bed is flat / 2 pillows  SABA use: no hfa/ still neb twice daily  02: none Patient Instructions  Plan A = Automatic = Always=    Symbicort  160 Take 2 puffs first thing in am and then another 2 puffs about 12 hours later.  Work on Printmaker B = Backup (to supplement plan A, not to replace it) Use your albuterol  inhaler as a rescue medication  Plan C = Crisis (instead of Plan B but only if Plan B stops working) - only use your albuterol  nebulizer if you first try Plan B  Continue Pantoprazole  (protonix ) 40 mg   Take  30-60 min before first meal of the day and if  cough wheeze at night just add  Pepcid  (famotidine )  20 mg after supper of  before bedtime  Please schedule a follow up office visit in 4 weeks, sooner if needed  with all medications /inhalers/ solutions in hand       01/29/2024  f/u ov/[place] office/Kathryn Harris re: *** maint on ***   did *** bring meds  No chief complaint on file.   Dyspnea:  *** Cough: *** Sleeping: ***   resp cc  SABA use: *** 02: ***  Lung cancer screening: ***   No obvious day to day or daytime variability or assoc excess/ purulent sputum or mucus plugs or hemoptysis or cp or chest tightness, subjective wheeze or overt sinus or hb symptoms.    Also denies any obvious fluctuation of symptoms with weather or environmental changes or other aggravating or alleviating factors except as outlined above   No unusual exposure hx or h/o childhood pna/ asthma or knowledge of premature birth.  Current Allergies, Complete Past Medical History, Past Surgical History, Family History, and Social History were reviewed in [employer] record.  ROS  The following are not active complaints unless bolded Hoarseness, sore throat, dysphagia,  dental problems, itching, sneezing,  nasal congestion or discharge of excess mucus or purulent secretions, ear ache,   fever, chills, sweats, unintended wt loss or wt gain, classically pleuritic or exertional cp,  orthopnea pnd or arm/hand swelling  or leg swelling, presyncope, palpitations, abdominal pain, anorexia, nausea, vomiting, diarrhea  or change in bowel habits or change in bladder habits, change in stools or change in urine, dysuria, hematuria,  rash, arthralgias, visual complaints, headache, numbness, weakness or ataxia or problems with walking or coordination,  change in mood or  memory.         Outpatient Medications Prior to Visit  Medication Sig Dispense Refill   albuterol  (PROVENTIL ) (2.5 MG/3ML) 0.083% nebulizer solution Take 3 mLs (2.5 mg total) by  nebulization every 4 (four) hours as needed for wheezing or shortness of breath. 75 mL 4   albuterol  (VENTOLIN  HFA) 108 (90 Base) MCG/ACT inhaler Inhale 2 puffs into the lungs every 6 (six) hours as needed for wheezing or shortness of breath. 1 each 2   azelastine  (ASTELIN ) 0.1 % nasal spray Place 2 sprays into both nostrils 2 (two) times daily. Use in each nostril as directed 30 mL 4   budesonide -formoterol  (SYMBICORT ) 160-4.5 MCG/ACT inhaler Inhale 2 puffs into the lungs 2 (two) times daily. For chronic Asthma Maintenance, NOT for Rescue 10.2 g 5   pantoprazole  (PROTONIX ) 40 MG tablet Take 1 tablet (40 mg total) by mouth daily. 30 tablet 3   promethazine -dextromethorphan  (PROMETHAZINE -DM) 6.25-15 MG/5ML syrup Take 5 mLs by mouth 4 (four) times daily as needed. 118 mL 0   No facility-administered medications prior to visit.          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

## 2024-02-03 DEATH — deceased

## 2024-03-21 ENCOUNTER — Inpatient Hospital Stay

## 2024-03-28 ENCOUNTER — Inpatient Hospital Stay: Admitting: Oncology
